# Patient Record
Sex: Male | Born: 1950 | Race: White | Hispanic: No | Marital: Married | Smoking: Current every day smoker
Health system: Southern US, Community
[De-identification: ages and names within clinical notes are randomized; demographics above are authoritative.]

## PROBLEM LIST (undated history)

## (undated) DIAGNOSIS — E119 Type 2 diabetes mellitus without complications: Secondary | ICD-10-CM

## (undated) DIAGNOSIS — E7800 Pure hypercholesterolemia, unspecified: Secondary | ICD-10-CM

## (undated) DIAGNOSIS — I739 Peripheral vascular disease, unspecified: Secondary | ICD-10-CM

## (undated) DIAGNOSIS — D649 Anemia, unspecified: Secondary | ICD-10-CM

## (undated) DIAGNOSIS — E785 Hyperlipidemia, unspecified: Secondary | ICD-10-CM

## (undated) DIAGNOSIS — K219 Gastro-esophageal reflux disease without esophagitis: Secondary | ICD-10-CM

## (undated) DIAGNOSIS — R05 Cough: Secondary | ICD-10-CM

## (undated) DIAGNOSIS — J189 Pneumonia, unspecified organism: Secondary | ICD-10-CM

## (undated) DIAGNOSIS — R079 Chest pain, unspecified: Secondary | ICD-10-CM

## (undated) DIAGNOSIS — J45909 Unspecified asthma, uncomplicated: Secondary | ICD-10-CM

## (undated) DIAGNOSIS — R059 Cough, unspecified: Secondary | ICD-10-CM

## (undated) DIAGNOSIS — R7303 Prediabetes: Secondary | ICD-10-CM

## (undated) DIAGNOSIS — F32A Depression, unspecified: Secondary | ICD-10-CM

## (undated) DIAGNOSIS — K861 Other chronic pancreatitis: Secondary | ICD-10-CM

## (undated) DIAGNOSIS — F329 Major depressive disorder, single episode, unspecified: Secondary | ICD-10-CM

## (undated) DIAGNOSIS — M1990 Unspecified osteoarthritis, unspecified site: Secondary | ICD-10-CM

## (undated) DIAGNOSIS — I499 Cardiac arrhythmia, unspecified: Secondary | ICD-10-CM

## (undated) DIAGNOSIS — F17210 Nicotine dependence, cigarettes, uncomplicated: Secondary | ICD-10-CM

## (undated) DIAGNOSIS — K635 Polyp of colon: Secondary | ICD-10-CM

## (undated) DIAGNOSIS — Z87442 Personal history of urinary calculi: Secondary | ICD-10-CM

## (undated) DIAGNOSIS — I10 Essential (primary) hypertension: Secondary | ICD-10-CM

## (undated) DIAGNOSIS — J209 Acute bronchitis, unspecified: Secondary | ICD-10-CM

## (undated) DIAGNOSIS — G4700 Insomnia, unspecified: Secondary | ICD-10-CM

## (undated) DIAGNOSIS — N19 Unspecified kidney failure: Secondary | ICD-10-CM

## (undated) HISTORY — DX: Hyperlipidemia, unspecified: E78.5

## (undated) HISTORY — DX: Unspecified kidney failure: N19

## (undated) HISTORY — DX: Peripheral vascular disease, unspecified: I73.9

## (undated) HISTORY — DX: Major depressive disorder, single episode, unspecified: F32.9

## (undated) HISTORY — PX: CARDIAC CATHETERIZATION: SHX172

## (undated) HISTORY — DX: Chest pain, unspecified: R07.9

## (undated) HISTORY — DX: Nicotine dependence, cigarettes, uncomplicated: F17.210

## (undated) HISTORY — PX: CHOLECYSTECTOMY: SHX55

## (undated) HISTORY — DX: Insomnia, unspecified: G47.00

## (undated) HISTORY — DX: Pure hypercholesterolemia, unspecified: E78.00

## (undated) HISTORY — DX: Acute bronchitis, unspecified: J20.9

## (undated) HISTORY — DX: Depression, unspecified: F32.A

## (undated) HISTORY — DX: Gastro-esophageal reflux disease without esophagitis: K21.9

## (undated) HISTORY — DX: Cough, unspecified: R05.9

## (undated) HISTORY — DX: Type 2 diabetes mellitus without complications: E11.9

## (undated) HISTORY — PX: PANCREAS SURGERY: SHX731

## (undated) HISTORY — DX: Other chronic pancreatitis: K86.1

## (undated) HISTORY — DX: Cough: R05

## (undated) HISTORY — DX: Essential (primary) hypertension: I10

## (undated) HISTORY — PX: COLONOSCOPY: SHX174

---

## 2002-09-02 ENCOUNTER — Ambulatory Visit (HOSPITAL_COMMUNITY): Admission: RE | Admit: 2002-09-02 | Discharge: 2002-09-02 | Payer: Self-pay | Admitting: Cardiology

## 2006-11-10 HISTORY — PX: CIRCUMCISION: SUR203

## 2011-03-28 NOTE — Cardiovascular Report (Signed)
   NAME:  Chad Mccall, Chad Mccall                       ACCOUNT NO.:  [id number]   MEDICAL RECORD NO.:  [id number]                   PATIENT TYPE:  OIB   LOCATION:  2899                                 FACILITY:  [facility]   PHYSICIAN:  Jenkins Rouge, MD [facility]                DATE OF BIRTH:  [birth year]   DATE OF PROCEDURE:  DATE OF DISCHARGE:  09/02/2002                              CARDIAC CATHETERIZATION   PROCEDURES:  Coronary arteriography.   CARDIOLOGIST:  Jenkins Rouge, MD   ACCESS:  Right femoral artery.   RESULTS:  1. The left main coronary artery  had a 20% discrete stenosis.  2. Left anterior descending artery was normal.  3. The first diagonal branch had a 50% tubular lesion at the ostium.  4. Circumflex coronary artery was normal.  5. The right coronary artery had a 30% proximal lesion. At the end of the     catheter, there was some catheter tip spasm.  6. The mid and distal vessel were normal.  There was a 30% ostial PDA     lesion.  7. Left ventriculography was normal, EF 60%.  There was no gradient across     the aortic valve and no MR.  Aortic pressure was in the 100/51 range.  LV     pressure was in the 100/10 range.   IMPRESSION:  Will review the films with Dr. Albertine Patricia; however, the patient's  Cardiolite showed a question of a small area of inferior apical wall  ischemia.  This does not correspond to the area of the diagonal branch.  In  talking to the patient, his primary complaints have been dyspnea.  He does  not have in my mind convincing angina since the diagonal lesion does not  correspond to the area of potential ischemia and the Cardiolite study.  I  think an initial attempt at medical therapy would be warranted.   I will review the films with Dr. Albertine Patricia and see if he concurs.                                                Jenkins Rouge, MD [facility]    PN/MEDQ  D:  09/02/2002  T:  09/03/2002  Job:  [id number]   cc:   Satira Sark, M.D. [facility]

## 2011-10-11 DIAGNOSIS — N19 Unspecified kidney failure: Secondary | ICD-10-CM

## 2011-10-11 HISTORY — DX: Unspecified kidney failure: N19

## 2012-08-10 DIAGNOSIS — R079 Chest pain, unspecified: Secondary | ICD-10-CM

## 2018-04-12 DIAGNOSIS — I10 Essential (primary) hypertension: Secondary | ICD-10-CM | POA: Diagnosis not present

## 2018-04-12 DIAGNOSIS — G4700 Insomnia, unspecified: Secondary | ICD-10-CM | POA: Diagnosis not present

## 2018-04-12 DIAGNOSIS — Z6824 Body mass index (BMI) 24.0-24.9, adult: Secondary | ICD-10-CM | POA: Diagnosis not present

## 2018-04-12 DIAGNOSIS — Z299 Encounter for prophylactic measures, unspecified: Secondary | ICD-10-CM | POA: Diagnosis not present

## 2018-04-12 DIAGNOSIS — Z713 Dietary counseling and surveillance: Secondary | ICD-10-CM | POA: Diagnosis not present

## 2018-06-10 DIAGNOSIS — I10 Essential (primary) hypertension: Secondary | ICD-10-CM | POA: Diagnosis not present

## 2018-06-10 DIAGNOSIS — G4700 Insomnia, unspecified: Secondary | ICD-10-CM | POA: Diagnosis not present

## 2018-06-10 DIAGNOSIS — I739 Peripheral vascular disease, unspecified: Secondary | ICD-10-CM | POA: Diagnosis not present

## 2018-06-10 DIAGNOSIS — K861 Other chronic pancreatitis: Secondary | ICD-10-CM | POA: Diagnosis not present

## 2018-06-10 DIAGNOSIS — R05 Cough: Secondary | ICD-10-CM | POA: Diagnosis not present

## 2018-06-10 DIAGNOSIS — Z299 Encounter for prophylactic measures, unspecified: Secondary | ICD-10-CM | POA: Diagnosis not present

## 2018-06-10 DIAGNOSIS — R079 Chest pain, unspecified: Secondary | ICD-10-CM | POA: Diagnosis not present

## 2018-06-10 DIAGNOSIS — Z6824 Body mass index (BMI) 24.0-24.9, adult: Secondary | ICD-10-CM | POA: Diagnosis not present

## 2018-06-10 DIAGNOSIS — F17210 Nicotine dependence, cigarettes, uncomplicated: Secondary | ICD-10-CM | POA: Diagnosis not present

## 2018-06-21 DIAGNOSIS — R079 Chest pain, unspecified: Secondary | ICD-10-CM | POA: Diagnosis not present

## 2018-06-21 DIAGNOSIS — R9431 Abnormal electrocardiogram [ECG] [EKG]: Secondary | ICD-10-CM | POA: Diagnosis not present

## 2018-07-08 DIAGNOSIS — I10 Essential (primary) hypertension: Secondary | ICD-10-CM | POA: Diagnosis not present

## 2018-07-08 DIAGNOSIS — E7800 Pure hypercholesterolemia, unspecified: Secondary | ICD-10-CM | POA: Diagnosis not present

## 2018-07-08 DIAGNOSIS — F329 Major depressive disorder, single episode, unspecified: Secondary | ICD-10-CM | POA: Diagnosis not present

## 2018-07-16 ENCOUNTER — Encounter: Payer: Self-pay | Admitting: *Deleted

## 2018-07-26 ENCOUNTER — Encounter

## 2018-07-26 ENCOUNTER — Encounter: Payer: Self-pay | Admitting: *Deleted

## 2018-07-26 ENCOUNTER — Encounter: Payer: Self-pay | Admitting: Cardiovascular Disease

## 2018-07-26 ENCOUNTER — Ambulatory Visit: Payer: PRIVATE HEALTH INSURANCE | Admitting: Cardiovascular Disease

## 2018-07-26 VITALS — BP 133/73 | HR 74 | Ht 64.0 in | Wt 141.6 lb

## 2018-07-26 DIAGNOSIS — R079 Chest pain, unspecified: Secondary | ICD-10-CM

## 2018-07-26 DIAGNOSIS — Z720 Tobacco use: Secondary | ICD-10-CM | POA: Diagnosis not present

## 2018-07-26 DIAGNOSIS — Z01812 Encounter for preprocedural laboratory examination: Secondary | ICD-10-CM | POA: Diagnosis not present

## 2018-07-26 DIAGNOSIS — E785 Hyperlipidemia, unspecified: Secondary | ICD-10-CM

## 2018-07-26 DIAGNOSIS — R0989 Other specified symptoms and signs involving the circulatory and respiratory systems: Secondary | ICD-10-CM

## 2018-07-26 DIAGNOSIS — I10 Essential (primary) hypertension: Secondary | ICD-10-CM

## 2018-07-26 NOTE — Progress Notes (Signed)
CARDIOLOGY CONSULT NOTE  Patient ID: Chad Mccall MRN: [id number] DOB/AGE: July 06, 1951 67 y.o.  Admit date: (Not on file) Primary Physician: Glenda Chroman, MD Referring Physician: Glenda Chroman, MD  Reason for Consultation: Chest pain  HPI: Chad Mccall is a 67 y.o. male who is being seen today for the evaluation of chest pain at the request of Vyas, Dhruv B, MD.  I personally reviewed records from his PCP.  I personally reviewed an ECG performed on 06/10/2018 that demonstrated normal sinus rhythm with no ischemic ST segment or T wave abnormality's, nor any arrhythmias.  I reviewed an echocardiogram report dated 06/21/2018 which demonstrated vigorous left ventricular systolic function, LVEF 5993%, mild LVH, and diastolic dysfunction.  Past medical history includes hyperlipidemia, tobacco abuse, and hypertension.  He tells me he has had chest pain on and off for years.  It is sometimes on the left side of his chest and sometimes on the right side of his chest.  It occasionally radiates into his back.  It is sometimes associated with left arm numbness.  He said he underwent coronary angiography at [facility] in 2005 and had minor blockages.  He is undergone 2 or 3 nuclear stress test most recently roughly 10 years ago at [facility].  About 3 months ago he was taking his autistic grandson to [place].  His grandson is 6 feet tall.  He almost fell on him and as the patient was trying to balance himself, he experienced severe precordial pain.  Brought him to the ground.  He said it was the worst type of chest pain he is ever experienced.  He Thana Farr himself down and the pain subsided.  He did not want to go to the ED because he was with his grandson and he was worried about his care.  He has chest pain on a near daily basis for the past year.  He has been smoking a pack of cigarettes daily since the age of 35.  He said his heart often races and he has been on metoprolol  and isosorbide mononitrate since 1997.  His blood pressure will suddenly drop for no particular reason.  Family history: There is a strong family history of premature coronary artery disease and CHF in both parents and paternal and maternal aunts and uncles.  Many people succumbed to heart disease by the age of 22.   Allergies  Allergen Reactions  . Codeine Hives    Palpitations     Current Outpatient Medications  Medication Sig Dispense Refill  . albuterol (VENTOLIN HFA) 108 (90 Base) MCG/ACT inhaler Inhale 2 puffs into the lungs 4 (four) times daily as needed for wheezing or shortness of breath.    . benzonatate (TESSALON) 100 MG capsule Take 2 capsules by mouth 3 (three) times daily as needed.  2  . chlorpheniramine (CHLOR-TRIMETON) 4 MG tablet Take 4 mg by mouth 2 (two) times daily as needed for allergies.    Marland Kitchen dicyclomine (BENTYL) 10 MG capsule Take 10 mg by mouth 2 (two) times daily.    . fenofibrate 160 MG tablet Take 160 mg by mouth daily.    . fosinopril (MONOPRIL) 10 MG tablet Take 1 tablet by mouth daily.    Marland Kitchen gabapentin (NEURONTIN) 300 MG capsule Take 300 mg by mouth 2 (two) times daily.    Marland Kitchen ibuprofen (ADVIL,MOTRIN) 200 MG tablet Take 1 tablet by mouth 3 (three) times daily as needed.    Marland Kitchen  isosorbide mononitrate (IMDUR) 30 MG 24 hr tablet Take 1 tablet by mouth 2 (two) times daily.    . meloxicam (MOBIC) 15 MG tablet Take 1 tablet by mouth daily.    . metoprolol tartrate (LOPRESSOR) 50 MG tablet Take 50 mg by mouth. One tab every morning & 1/2 every evening    . nitroGLYCERIN (NITROSTAT) 0.4 MG SL tablet Place 1 tablet under the tongue every 5 (five) minutes x 3 doses as needed.    . ondansetron (ZOFRAN) 4 MG tablet Take 4 mg by mouth every 8 (eight) hours as needed for nausea or vomiting.    . pantoprazole (PROTONIX) 40 MG tablet Take 40 mg by mouth daily.    . promethazine (PHENERGAN) 25 MG tablet Take 25 mg by mouth every 6 (six) hours as needed for nausea or vomiting.     . ranitidine (ZANTAC) 300 MG tablet Take 300 mg by mouth daily.    . simvastatin (ZOCOR) 40 MG tablet Take 40 mg by mouth daily.    . temazepam (RESTORIL) 15 MG capsule Take 15 mg by mouth at bedtime.    Marland Kitchen terazosin (HYTRIN) 5 MG capsule Take 5 mg by mouth daily.    . traMADol (ULTRAM) 50 MG tablet Take 50 mg by mouth 3 (three) times daily as needed.     . traZODone (DESYREL) 50 MG tablet Take 50 mg by mouth at bedtime.    . triamcinolone cream (KENALOG) 0.1 % Apply 1 application topically 2 (two) times daily as needed.     No current facility-administered medications for this visit.     Past Medical History:  Diagnosis Date  . Acute bronchitis   . Chest pain   . Chronic pancreatitis ([place])   . Cough   . Depression    [facility], was put on Trazodone  . Diabetes mellitus without complication ([place])   . Esophageal reflux   . Hyperlipidemia   . Hypertension   . Insomnia   . Nicotine dependence, cigarettes, uncomplicated   . PAD (peripheral artery disease) ([place])   . Pure hypercholesterolemia   . Renal failure 10/2011   due to kidney stone passed spontaneously     Past Surgical History:  Procedure Laterality Date  . CIRCUMCISION  2008    Social History   Socioeconomic History  . Marital status: Married    Spouse name: Not on file  . Number of children: Not on file  . Years of education: Not on file  . Highest education level: Not on file  Occupational History  . Not on file  Social Needs  . Financial resource strain: Not on file  . Food insecurity:    Worry: Not on file    Inability: Not on file  . Transportation needs:    Medical: Not on file    Non-medical: Not on file  Tobacco Use  . Smoking status: Current Every Day Smoker    Packs/day: 1.50    Years: 16.00    Pack years: 24.00    Types: Cigarettes    Start date: 08/15/1965  . Smokeless tobacco: Never Used  Substance and Sexual Activity  . Alcohol use: Not Currently  . Drug use: Not on file  .  Sexual activity: Not on file  Lifestyle  . Physical activity:    Days per week: Not on file    Minutes per session: Not on file  . Stress: Not on file  Relationships  . Social connections:    Talks  on phone: Not on file    Gets together: Not on file    Attends religious service: Not on file    Active member of club or organization: Not on file    Attends meetings of clubs or organizations: Not on file    Relationship status: Not on file  . Intimate partner violence:    Fear of current or ex partner: Not on file    Emotionally abused: Not on file    Physically abused: Not on file    Forced sexual activity: Not on file  Other Topics Concern  . Not on file  Social History Narrative  . Not on file      Current Meds  Medication Sig  . albuterol (VENTOLIN HFA) 108 (90 Base) MCG/ACT inhaler Inhale 2 puffs into the lungs 4 (four) times daily as needed for wheezing or shortness of breath.  . benzonatate (TESSALON) 100 MG capsule Take 2 capsules by mouth 3 (three) times daily as needed.  . chlorpheniramine (CHLOR-TRIMETON) 4 MG tablet Take 4 mg by mouth 2 (two) times daily as needed for allergies.  Marland Kitchen dicyclomine (BENTYL) 10 MG capsule Take 10 mg by mouth 2 (two) times daily.  . fenofibrate 160 MG tablet Take 160 mg by mouth daily.  . fosinopril (MONOPRIL) 10 MG tablet Take 1 tablet by mouth daily.  Marland Kitchen gabapentin (NEURONTIN) 300 MG capsule Take 300 mg by mouth 2 (two) times daily.  Marland Kitchen ibuprofen (ADVIL,MOTRIN) 200 MG tablet Take 1 tablet by mouth 3 (three) times daily as needed.  . isosorbide mononitrate (IMDUR) 30 MG 24 hr tablet Take 1 tablet by mouth 2 (two) times daily.  . meloxicam (MOBIC) 15 MG tablet Take 1 tablet by mouth daily.  . metoprolol tartrate (LOPRESSOR) 50 MG tablet Take 50 mg by mouth. One tab every morning & 1/2 every evening  . nitroGLYCERIN (NITROSTAT) 0.4 MG SL tablet Place 1 tablet under the tongue every 5 (five) minutes x 3 doses as needed.  . ondansetron (ZOFRAN)  4 MG tablet Take 4 mg by mouth every 8 (eight) hours as needed for nausea or vomiting.  . pantoprazole (PROTONIX) 40 MG tablet Take 40 mg by mouth daily.  . promethazine (PHENERGAN) 25 MG tablet Take 25 mg by mouth every 6 (six) hours as needed for nausea or vomiting.  . ranitidine (ZANTAC) 300 MG tablet Take 300 mg by mouth daily.  . simvastatin (ZOCOR) 40 MG tablet Take 40 mg by mouth daily.  . temazepam (RESTORIL) 15 MG capsule Take 15 mg by mouth at bedtime.  Marland Kitchen terazosin (HYTRIN) 5 MG capsule Take 5 mg by mouth daily.  . traMADol (ULTRAM) 50 MG tablet Take 50 mg by mouth 3 (three) times daily as needed.   . traZODone (DESYREL) 50 MG tablet Take 50 mg by mouth at bedtime.  . triamcinolone cream (KENALOG) 0.1 % Apply 1 application topically 2 (two) times daily as needed.      Review of systems complete and found to be negative unless listed above in HPI    Physical exam Blood pressure 133/73, pulse 74, height 5\' 4"  (1.626 m), weight 141 lb 9.6 oz (64.2 kg), SpO2 96 %. General: NAD Neck: No JVD, no thyromegaly or thyroid nodule.  Lungs: Clear to auscultation bilaterally with normal respiratory effort. CV: Nondisplaced PMI. Regular rate and rhythm, normal S1/S2, no S3/S4, no murmur.  No peripheral edema.  Right carotid bruit.    Abdomen: Soft, nontender, no distention.  Skin: Intact without lesions  or rashes.  Neurologic: Alert and oriented x 3.  Psych: Normal affect. Extremities: No clubbing or cyanosis.  HEENT: Normal.   ECG: Most recent ECG reviewed.   Labs: No results found for: K, BUN, CREATININE, ALT, TSH, HGB   Lipids: No results found for: LDLCALC, LDLDIRECT, CHOL, TRIG, HDL      ASSESSMENT AND PLAN:   1.  Chest pain: Given his multiple cardiovascular risk factors as detailed above, I am concerned that he has ischemic heart disease.  He is currently on metoprolol, Imdur, and simvastatin.  He does not take aspirin because he said he bleeds and bruises too easily.  I  will arrange for coronary CT angiography for further evaluation.  2.  Hypertension: Blood pressure is controlled.  No changes to therapy.  3.  Hyperlipidemia: Currently on statin therapy.  I will obtain a copy of lipids from PCP.    4.  Right carotid bruit: Currently on statin therapy.  I will obtain carotid Dopplers.  5.  Tobacco abuse: He has smoked a pack of cigarettes daily since the age of 83.    Disposition: Follow up in 3 months  Signed: Kate Sable, M.D., F.A.C.C.  07/26/2018, 8:38 AM

## 2018-07-26 NOTE — Patient Instructions (Addendum)
Medication Instructions:  Continue all current medications.  Labwork:  BMET - order given today.   Do prior to your CT.    Testing/Procedures:  Coronary CT at [facility]. Your physician has requested that you have a carotid duplex. This test is an ultrasound of the carotid arteries in your neck. It looks at blood flow through these arteries that supply the brain with blood. Allow one hour for this exam. There are no restrictions or special instructions.  Office will contact with results via phone or letter.    Follow-Up: 3 months   Any Other Special Instructions Will Be Listed Below (If Applicable).  If you need a refill on your cardiac medications before your next appointment, please call your pharmacy.

## 2018-08-06 DIAGNOSIS — E7800 Pure hypercholesterolemia, unspecified: Secondary | ICD-10-CM | POA: Diagnosis not present

## 2018-08-06 DIAGNOSIS — F329 Major depressive disorder, single episode, unspecified: Secondary | ICD-10-CM | POA: Diagnosis not present

## 2018-08-06 DIAGNOSIS — I10 Essential (primary) hypertension: Secondary | ICD-10-CM | POA: Diagnosis not present

## 2018-08-12 ENCOUNTER — Ambulatory Visit (INDEPENDENT_AMBULATORY_CARE_PROVIDER_SITE_OTHER): Payer: Medicare PPO

## 2018-08-12 DIAGNOSIS — R0989 Other specified symptoms and signs involving the circulatory and respiratory systems: Secondary | ICD-10-CM | POA: Diagnosis not present

## 2018-08-13 ENCOUNTER — Telehealth: Payer: Self-pay | Admitting: *Deleted

## 2018-08-13 ENCOUNTER — Other Ambulatory Visit: Payer: Self-pay | Admitting: *Deleted

## 2018-08-13 DIAGNOSIS — R0989 Other specified symptoms and signs involving the circulatory and respiratory systems: Secondary | ICD-10-CM

## 2018-08-13 NOTE — Telephone Encounter (Signed)
Notes recorded by Laurine Blazer, LPN on 79/07/8720 at 5:87 PM EDT Patient notified. Copy to pmd. Follow up scheduled for December with Dr. Bronson Ing.   ------  Notes recorded by Herminio Commons, MD on 08/13/2018 at 9:37 AM EDT Minimal carotid disease. This can be repeated in 3 to 5 years.

## 2018-08-24 DIAGNOSIS — Z01812 Encounter for preprocedural laboratory examination: Secondary | ICD-10-CM | POA: Diagnosis not present

## 2018-08-25 ENCOUNTER — Telehealth: Payer: Self-pay | Admitting: *Deleted

## 2018-08-25 ENCOUNTER — Encounter: Payer: Self-pay | Admitting: *Deleted

## 2018-08-25 DIAGNOSIS — R079 Chest pain, unspecified: Secondary | ICD-10-CM

## 2018-08-25 NOTE — Telephone Encounter (Signed)
Patient informed. Lexiscan instructions read to patient and mailed to home address.

## 2018-08-25 NOTE — Telephone Encounter (Signed)
Yes, can proceed with a Lexiscan.

## 2018-08-25 NOTE — Telephone Encounter (Signed)
Patient informed and verbalized understanding

## 2018-08-25 NOTE — Telephone Encounter (Signed)
-----   Message from Herminio Commons, MD sent at 08/25/2018 11:47 AM EDT ----- The following abnormalities are noted: BUN and creatinine are elevated consistent with stage IV chronic kidney disease. All other values are normal, stable or within acceptable limits. Medication changes / Follow up labs / Other changes or recommendations:   I would cancel coronary CT angiography and obtain an exercise Myoview stress test so as to avoid contrast. Please forward a copy to PCP.  Kate Sable, MD 08/25/2018 11:47 AM

## 2018-08-26 ENCOUNTER — Telehealth: Payer: Self-pay | Admitting: Cardiovascular Disease

## 2018-08-26 NOTE — Telephone Encounter (Signed)
Pre-cert Verification for the following procedure   Lexiscan scheduled for 08-30-2018 at [facility]

## 2018-08-30 ENCOUNTER — Encounter (HOSPITAL_BASED_OUTPATIENT_CLINIC_OR_DEPARTMENT_OTHER)
Admission: RE | Admit: 2018-08-30 | Discharge: 2018-08-30 | Disposition: A | Discharge status: Home / Self Care | Payer: Medicare PPO | Source: Ambulatory Visit | Attending: Cardiovascular Disease | Admitting: Cardiovascular Disease

## 2018-08-30 ENCOUNTER — Encounter (HOSPITAL_COMMUNITY): Payer: Self-pay

## 2018-08-30 ENCOUNTER — Encounter (HOSPITAL_COMMUNITY)
Admission: RE | Admit: 2018-08-30 | Discharge: 2018-08-30 | Disposition: A | Discharge status: Home / Self Care | Payer: Medicare PPO | Source: Ambulatory Visit | Attending: Cardiovascular Disease | Admitting: Cardiovascular Disease

## 2018-08-30 ENCOUNTER — Telehealth: Payer: Self-pay | Admitting: *Deleted

## 2018-08-30 DIAGNOSIS — R079 Chest pain, unspecified: Secondary | ICD-10-CM

## 2018-08-30 LAB — NM MYOCAR MULTI W/SPECT W/WALL MOTION / EF
CHL CUP RESTING HR STRESS: 67 {beats}/min
LHR: 0.54
LV dias vol: 79 mL (ref 62–150)
LVSYSVOL: 31 mL
Peak HR: 102 {beats}/min
SDS: 0
SRS: 1
SSS: 1
TID: 1.21

## 2018-08-30 MED ORDER — METOPROLOL TARTRATE 5 MG/5ML IV SOLN
INTRAVENOUS | Status: AC
  Filled 2018-08-30: qty 5

## 2018-08-30 MED ORDER — TECHNETIUM TC 99M TETROFOSMIN IV KIT
10.0000 | PACK | Freq: Once | INTRAVENOUS | Status: AC | PRN
  Administered 2018-08-30: 10.3 via INTRAVENOUS

## 2018-08-30 MED ORDER — TECHNETIUM TC 99M TETROFOSMIN IV KIT
30.0000 | PACK | Freq: Once | INTRAVENOUS | Status: AC | PRN
  Administered 2018-08-30: 31.8 via INTRAVENOUS

## 2018-08-30 MED ORDER — REGADENOSON 0.4 MG/5ML IV SOLN
INTRAVENOUS | Status: AC
  Administered 2018-08-30: 0.4 mg via INTRAVENOUS
  Filled 2018-08-30: qty 5

## 2018-08-30 MED ORDER — SODIUM CHLORIDE 0.9% FLUSH
INTRAVENOUS | Status: AC
  Administered 2018-08-30: 10 mL via INTRAVENOUS
  Filled 2018-08-30: qty 10

## 2018-08-30 NOTE — Telephone Encounter (Signed)
Notes recorded by Laurine Blazer, LPN on 00/51/1021 at 5:26 PM EDT Patient notified. Copy to pmd. Follow up scheduled for 11/08/2018 with Dr. Bronson Ing.   ------  Notes recorded by Herminio Commons, MD on 08/30/2018 at 4:24 PM EDT This study demonstrates: Normal. No evidence of significant blockages. Medication changes / Follow up studies / Other recommendations:  None. Please send results to the PCP: Glenda Chroman, MD  Kate Sable, MD 08/30/2018 4:23 PM

## 2018-09-03 DIAGNOSIS — I10 Essential (primary) hypertension: Secondary | ICD-10-CM | POA: Diagnosis not present

## 2018-09-03 DIAGNOSIS — F329 Major depressive disorder, single episode, unspecified: Secondary | ICD-10-CM | POA: Diagnosis not present

## 2018-09-03 DIAGNOSIS — E7800 Pure hypercholesterolemia, unspecified: Secondary | ICD-10-CM | POA: Diagnosis not present

## 2018-09-06 ENCOUNTER — Ambulatory Visit (HOSPITAL_COMMUNITY): Payer: Medicare PPO

## 2018-09-10 DIAGNOSIS — I10 Essential (primary) hypertension: Secondary | ICD-10-CM | POA: Diagnosis not present

## 2018-09-10 DIAGNOSIS — Z6824 Body mass index (BMI) 24.0-24.9, adult: Secondary | ICD-10-CM | POA: Diagnosis not present

## 2018-09-10 DIAGNOSIS — I739 Peripheral vascular disease, unspecified: Secondary | ICD-10-CM | POA: Diagnosis not present

## 2018-09-10 DIAGNOSIS — K921 Melena: Secondary | ICD-10-CM | POA: Diagnosis not present

## 2018-09-10 DIAGNOSIS — Z299 Encounter for prophylactic measures, unspecified: Secondary | ICD-10-CM | POA: Diagnosis not present

## 2018-09-10 DIAGNOSIS — E785 Hyperlipidemia, unspecified: Secondary | ICD-10-CM | POA: Diagnosis not present

## 2018-09-16 DIAGNOSIS — K922 Gastrointestinal hemorrhage, unspecified: Secondary | ICD-10-CM | POA: Diagnosis not present

## 2018-09-16 DIAGNOSIS — E1122 Type 2 diabetes mellitus with diabetic chronic kidney disease: Secondary | ICD-10-CM | POA: Diagnosis not present

## 2018-09-16 DIAGNOSIS — K625 Hemorrhage of anus and rectum: Secondary | ICD-10-CM | POA: Diagnosis not present

## 2018-09-16 DIAGNOSIS — R195 Other fecal abnormalities: Secondary | ICD-10-CM | POA: Diagnosis not present

## 2018-09-16 DIAGNOSIS — K641 Second degree hemorrhoids: Secondary | ICD-10-CM | POA: Diagnosis not present

## 2018-09-16 DIAGNOSIS — R42 Dizziness and giddiness: Secondary | ICD-10-CM | POA: Diagnosis not present

## 2018-09-16 DIAGNOSIS — K921 Melena: Secondary | ICD-10-CM | POA: Diagnosis not present

## 2018-09-16 DIAGNOSIS — I2510 Atherosclerotic heart disease of native coronary artery without angina pectoris: Secondary | ICD-10-CM | POA: Diagnosis not present

## 2018-09-16 DIAGNOSIS — I129 Hypertensive chronic kidney disease with stage 1 through stage 4 chronic kidney disease, or unspecified chronic kidney disease: Secondary | ICD-10-CM | POA: Diagnosis not present

## 2018-09-16 DIAGNOSIS — N184 Chronic kidney disease, stage 4 (severe): Secondary | ICD-10-CM | POA: Diagnosis not present

## 2018-09-16 DIAGNOSIS — K5730 Diverticulosis of large intestine without perforation or abscess without bleeding: Secondary | ICD-10-CM | POA: Diagnosis not present

## 2018-09-16 DIAGNOSIS — R05 Cough: Secondary | ICD-10-CM | POA: Diagnosis not present

## 2018-09-16 DIAGNOSIS — D62 Acute posthemorrhagic anemia: Secondary | ICD-10-CM | POA: Diagnosis not present

## 2018-09-17 DIAGNOSIS — I129 Hypertensive chronic kidney disease with stage 1 through stage 4 chronic kidney disease, or unspecified chronic kidney disease: Secondary | ICD-10-CM | POA: Diagnosis not present

## 2018-09-17 DIAGNOSIS — K641 Second degree hemorrhoids: Secondary | ICD-10-CM | POA: Diagnosis not present

## 2018-09-17 DIAGNOSIS — N184 Chronic kidney disease, stage 4 (severe): Secondary | ICD-10-CM | POA: Diagnosis not present

## 2018-09-17 DIAGNOSIS — D62 Acute posthemorrhagic anemia: Secondary | ICD-10-CM | POA: Diagnosis not present

## 2018-09-17 DIAGNOSIS — R195 Other fecal abnormalities: Secondary | ICD-10-CM | POA: Diagnosis not present

## 2018-09-17 DIAGNOSIS — R42 Dizziness and giddiness: Secondary | ICD-10-CM | POA: Diagnosis not present

## 2018-09-17 DIAGNOSIS — K5730 Diverticulosis of large intestine without perforation or abscess without bleeding: Secondary | ICD-10-CM | POA: Diagnosis not present

## 2018-09-17 DIAGNOSIS — E1122 Type 2 diabetes mellitus with diabetic chronic kidney disease: Secondary | ICD-10-CM | POA: Diagnosis not present

## 2018-09-17 DIAGNOSIS — K922 Gastrointestinal hemorrhage, unspecified: Secondary | ICD-10-CM | POA: Diagnosis not present

## 2018-09-17 DIAGNOSIS — I2510 Atherosclerotic heart disease of native coronary artery without angina pectoris: Secondary | ICD-10-CM | POA: Diagnosis not present

## 2018-09-17 DIAGNOSIS — D509 Iron deficiency anemia, unspecified: Secondary | ICD-10-CM | POA: Diagnosis not present

## 2018-09-17 DIAGNOSIS — I10 Essential (primary) hypertension: Secondary | ICD-10-CM | POA: Diagnosis not present

## 2018-09-17 DIAGNOSIS — J45909 Unspecified asthma, uncomplicated: Secondary | ICD-10-CM | POA: Diagnosis not present

## 2018-09-17 DIAGNOSIS — K625 Hemorrhage of anus and rectum: Secondary | ICD-10-CM | POA: Diagnosis not present

## 2018-09-17 DIAGNOSIS — E119 Type 2 diabetes mellitus without complications: Secondary | ICD-10-CM | POA: Diagnosis not present

## 2018-09-17 DIAGNOSIS — K5733 Diverticulitis of large intestine without perforation or abscess with bleeding: Secondary | ICD-10-CM | POA: Diagnosis not present

## 2018-09-23 DIAGNOSIS — E785 Hyperlipidemia, unspecified: Secondary | ICD-10-CM | POA: Diagnosis not present

## 2018-09-23 DIAGNOSIS — Z6824 Body mass index (BMI) 24.0-24.9, adult: Secondary | ICD-10-CM | POA: Diagnosis not present

## 2018-09-23 DIAGNOSIS — K922 Gastrointestinal hemorrhage, unspecified: Secondary | ICD-10-CM | POA: Diagnosis not present

## 2018-09-23 DIAGNOSIS — I739 Peripheral vascular disease, unspecified: Secondary | ICD-10-CM | POA: Diagnosis not present

## 2018-09-23 DIAGNOSIS — I10 Essential (primary) hypertension: Secondary | ICD-10-CM | POA: Diagnosis not present

## 2018-09-23 DIAGNOSIS — Z299 Encounter for prophylactic measures, unspecified: Secondary | ICD-10-CM | POA: Diagnosis not present

## 2018-10-22 DIAGNOSIS — R5383 Other fatigue: Secondary | ICD-10-CM | POA: Diagnosis not present

## 2018-10-22 DIAGNOSIS — I10 Essential (primary) hypertension: Secondary | ICD-10-CM | POA: Diagnosis not present

## 2018-10-22 DIAGNOSIS — Z7189 Other specified counseling: Secondary | ICD-10-CM | POA: Diagnosis not present

## 2018-10-22 DIAGNOSIS — Z1211 Encounter for screening for malignant neoplasm of colon: Secondary | ICD-10-CM | POA: Diagnosis not present

## 2018-10-22 DIAGNOSIS — Z1331 Encounter for screening for depression: Secondary | ICD-10-CM | POA: Diagnosis not present

## 2018-10-22 DIAGNOSIS — Z299 Encounter for prophylactic measures, unspecified: Secondary | ICD-10-CM | POA: Diagnosis not present

## 2018-10-22 DIAGNOSIS — E7800 Pure hypercholesterolemia, unspecified: Secondary | ICD-10-CM | POA: Diagnosis not present

## 2018-10-22 DIAGNOSIS — Z1339 Encounter for screening examination for other mental health and behavioral disorders: Secondary | ICD-10-CM | POA: Diagnosis not present

## 2018-10-22 DIAGNOSIS — Z6824 Body mass index (BMI) 24.0-24.9, adult: Secondary | ICD-10-CM | POA: Diagnosis not present

## 2018-10-22 DIAGNOSIS — Z125 Encounter for screening for malignant neoplasm of prostate: Secondary | ICD-10-CM | POA: Diagnosis not present

## 2018-10-22 DIAGNOSIS — Z79899 Other long term (current) drug therapy: Secondary | ICD-10-CM | POA: Diagnosis not present

## 2018-10-22 DIAGNOSIS — Z0000 Encounter for general adult medical examination without abnormal findings: Secondary | ICD-10-CM | POA: Diagnosis not present

## 2018-10-28 DIAGNOSIS — F329 Major depressive disorder, single episode, unspecified: Secondary | ICD-10-CM | POA: Diagnosis not present

## 2018-10-28 DIAGNOSIS — I10 Essential (primary) hypertension: Secondary | ICD-10-CM | POA: Diagnosis not present

## 2018-10-28 DIAGNOSIS — E7800 Pure hypercholesterolemia, unspecified: Secondary | ICD-10-CM | POA: Diagnosis not present

## 2018-11-08 ENCOUNTER — Ambulatory Visit: Payer: Medicare PPO | Admitting: Cardiovascular Disease

## 2018-11-08 ENCOUNTER — Encounter: Payer: Self-pay | Admitting: Cardiovascular Disease

## 2018-11-08 VITALS — BP 110/70 | HR 71 | Ht 64.0 in | Wt 146.0 lb

## 2018-11-08 DIAGNOSIS — R079 Chest pain, unspecified: Secondary | ICD-10-CM | POA: Diagnosis not present

## 2018-11-08 DIAGNOSIS — N184 Chronic kidney disease, stage 4 (severe): Secondary | ICD-10-CM | POA: Diagnosis not present

## 2018-11-08 DIAGNOSIS — I10 Essential (primary) hypertension: Secondary | ICD-10-CM

## 2018-11-08 DIAGNOSIS — E785 Hyperlipidemia, unspecified: Secondary | ICD-10-CM

## 2018-11-08 DIAGNOSIS — Z720 Tobacco use: Secondary | ICD-10-CM | POA: Diagnosis not present

## 2018-11-08 DIAGNOSIS — R0989 Other specified symptoms and signs involving the circulatory and respiratory systems: Secondary | ICD-10-CM

## 2018-11-08 NOTE — Patient Instructions (Signed)
Medication Instructions:  Continue all current medications.  Labwork: none  Testing/Procedures: none  Follow-Up: As needed.    Any Other Special Instructions Will Be Listed Below (If Applicable).  If you need a refill on your cardiac medications before your next appointment, please call your pharmacy.  

## 2018-11-08 NOTE — Progress Notes (Signed)
SUBJECTIVE: The patient returns for follow-up after undergoing cardiovascular testing performed for the evaluation of chest pain.  He underwent a normal nuclear stress test on 08/30/2018.  Carotid Dopplers demonstrated 1 to 39% bilateral internal carotid artery stenosis on 08/12/2018.  It appears since his last visit with me he had an intestinal bleed which required hospitalization.  He underwent a colonoscopy at that time.  He continues to have occasional mild chest pains but nothing severe.  He was also found to have chronic kidney disease stage IV after I checked a basic metabolic panel.  His PCP is arranging for him to see a nephrologist.  He denies palpitations and exertional dyspnea.  He denies orthopnea and leg swelling.     Review of Systems: As per "subjective", otherwise negative.  Allergies  Allergen Reactions  . Codeine Hives    Palpitations     Current Outpatient Medications  Medication Sig Dispense Refill  . albuterol (VENTOLIN HFA) 108 (90 Base) MCG/ACT inhaler Inhale 2 puffs into the lungs 4 (four) times daily as needed for wheezing or shortness of breath.    . benzonatate (TESSALON) 100 MG capsule Take 2 capsules by mouth 3 (three) times daily as needed.  2  . chlorpheniramine (CHLOR-TRIMETON) 4 MG tablet Take 4 mg by mouth 2 (two) times daily as needed for allergies.    Marland Kitchen dicyclomine (BENTYL) 10 MG capsule Take 10 mg by mouth 2 (two) times daily.    . fenofibrate 160 MG tablet Take 160 mg by mouth daily.    . fosinopril (MONOPRIL) 10 MG tablet Take 1 tablet by mouth daily.    Marland Kitchen gabapentin (NEURONTIN) 300 MG capsule Take 300 mg by mouth 2 (two) times daily.    Marland Kitchen ibuprofen (ADVIL,MOTRIN) 200 MG tablet Take 1 tablet by mouth 3 (three) times daily as needed.    . isosorbide mononitrate (IMDUR) 30 MG 24 hr tablet Take 1 tablet by mouth 2 (two) times daily.    . meloxicam (MOBIC) 15 MG tablet Take 1 tablet by mouth daily.    . metoprolol tartrate (LOPRESSOR)  50 MG tablet Take 50 mg by mouth. One tab every morning & 1/2 every evening    . nitroGLYCERIN (NITROSTAT) 0.4 MG SL tablet Place 1 tablet under the tongue every 5 (five) minutes x 3 doses as needed.    . ondansetron (ZOFRAN) 4 MG tablet Take 4 mg by mouth every 8 (eight) hours as needed for nausea or vomiting.    . pantoprazole (PROTONIX) 40 MG tablet Take 40 mg by mouth daily.    . promethazine (PHENERGAN) 25 MG tablet Take 25 mg by mouth every 6 (six) hours as needed for nausea or vomiting.    . ranitidine (ZANTAC) 300 MG tablet Take 300 mg by mouth daily.    . simvastatin (ZOCOR) 40 MG tablet Take 40 mg by mouth daily.    . temazepam (RESTORIL) 15 MG capsule Take 15 mg by mouth at bedtime.    Marland Kitchen terazosin (HYTRIN) 5 MG capsule Take 5 mg by mouth daily.    . traMADol (ULTRAM) 50 MG tablet Take 50 mg by mouth 3 (three) times daily as needed.     . traZODone (DESYREL) 50 MG tablet Take 50 mg by mouth at bedtime.    . triamcinolone cream (KENALOG) 0.1 % Apply 1 application topically 2 (two) times daily as needed.     No current facility-administered medications for this visit.     Past Medical  History:  Diagnosis Date  . Acute bronchitis   . Chest pain   . Chronic pancreatitis ([place])   . Cough   . Depression    [facility], was put on Trazodone  . Diabetes mellitus without complication ([place])   . Esophageal reflux   . Hyperlipidemia   . Hypertension   . Insomnia   . Nicotine dependence, cigarettes, uncomplicated   . PAD (peripheral artery disease) ([place])   . Pure hypercholesterolemia   . Renal failure 10/2011   due to kidney stone passed spontaneously     Past Surgical History:  Procedure Laterality Date  . CIRCUMCISION  2008    Social History   Socioeconomic History  . Marital status: Married    Spouse name: Not on file  . Number of children: Not on file  . Years of education: Not on file  . Highest education level: Not on file  Occupational History  . Not on  file  Social Needs  . Financial resource strain: Not on file  . Food insecurity:    Worry: Not on file    Inability: Not on file  . Transportation needs:    Medical: Not on file    Non-medical: Not on file  Tobacco Use  . Smoking status: Current Every Day Smoker    Packs/day: 1.50    Years: 16.00    Pack years: 24.00    Types: Cigarettes    Start date: 08/15/1965  . Smokeless tobacco: Never Used  Substance and Sexual Activity  . Alcohol use: Not Currently  . Drug use: Not on file  . Sexual activity: Not on file  Lifestyle  . Physical activity:    Days per week: Not on file    Minutes per session: Not on file  . Stress: Not on file  Relationships  . Social connections:    Talks on phone: Not on file    Gets together: Not on file    Attends religious service: Not on file    Active member of club or organization: Not on file    Attends meetings of clubs or organizations: Not on file    Relationship status: Not on file  . Intimate partner violence:    Fear of current or ex partner: Not on file    Emotionally abused: Not on file    Physically abused: Not on file    Forced sexual activity: Not on file  Other Topics Concern  . Not on file  Social History Narrative  . Not on file     Vitals:   11/08/18 1308  BP: 110/70  Pulse: 71  SpO2: 96%  Weight: 146 lb (66.2 kg)  Height: 5\' 4"  (1.626 m)    Wt Readings from Last 3 Encounters:  11/08/18 146 lb (66.2 kg)  07/26/18 141 lb 9.6 oz (64.2 kg)  06/10/18 146 lb (66.2 kg)     PHYSICAL EXAM General: NAD HEENT: Normal. Neck: No JVD, no thyromegaly. Lungs: Clear to auscultation bilaterally with normal respiratory effort. CV: Regular rate and rhythm, normal S1/S2, no S3/S4, no murmur. No pretibial or periankle edema.  Right carotid bruit.   Abdomen: Soft, nontender, no distention.  Neurologic: Alert and oriented.  Psych: Normal affect. Skin: Normal. Musculoskeletal: No gross deformities.    ECG: Reviewed above  under Subjective   Labs: No results found for: K, BUN, CREATININE, ALT, TSH, HGB   Lipids: No results found for: LDLCALC, LDLDIRECT, CHOL, TRIG, HDL     ASSESSMENT  AND PLAN: 1.  Chest pain: Normal nuclear stress test as detailed above.  No further cardiac testing is indicated at this time.  He is on beta-blockers and long-acting nitrates as prescribed by his PCP.  He is also on statin therapy.  2.  Hypertension: Controlled on present therapy.  No changes.  3.  Hyperlipidemia: Currently on statin therapy.  4.  Right carotid bruit: Carotid Dopplers reviewed above with minimal disease.  This can be repeated in several years.  5.  Tobacco abuse: He has smoked a pack of cigarettes daily since the age of 29.  89.  Chronic kidney disease stage IV: He is being referred to a nephrologist by his PCP.   Disposition: Follow up prn   Kate Sable, M.D., F.A.C.C.

## 2018-11-25 DIAGNOSIS — I10 Essential (primary) hypertension: Secondary | ICD-10-CM | POA: Diagnosis not present

## 2018-11-25 DIAGNOSIS — E7800 Pure hypercholesterolemia, unspecified: Secondary | ICD-10-CM | POA: Diagnosis not present

## 2018-11-25 DIAGNOSIS — F329 Major depressive disorder, single episode, unspecified: Secondary | ICD-10-CM | POA: Diagnosis not present

## 2018-11-30 DIAGNOSIS — N184 Chronic kidney disease, stage 4 (severe): Secondary | ICD-10-CM | POA: Diagnosis not present

## 2018-11-30 DIAGNOSIS — N390 Urinary tract infection, site not specified: Secondary | ICD-10-CM | POA: Diagnosis not present

## 2018-11-30 DIAGNOSIS — D649 Anemia, unspecified: Secondary | ICD-10-CM | POA: Diagnosis not present

## 2018-12-23 DIAGNOSIS — F329 Major depressive disorder, single episode, unspecified: Secondary | ICD-10-CM | POA: Diagnosis not present

## 2018-12-23 DIAGNOSIS — I10 Essential (primary) hypertension: Secondary | ICD-10-CM | POA: Diagnosis not present

## 2018-12-23 DIAGNOSIS — E7800 Pure hypercholesterolemia, unspecified: Secondary | ICD-10-CM | POA: Diagnosis not present

## 2019-02-04 DIAGNOSIS — E7800 Pure hypercholesterolemia, unspecified: Secondary | ICD-10-CM | POA: Diagnosis not present

## 2019-02-04 DIAGNOSIS — I10 Essential (primary) hypertension: Secondary | ICD-10-CM | POA: Diagnosis not present

## 2019-02-04 DIAGNOSIS — F329 Major depressive disorder, single episode, unspecified: Secondary | ICD-10-CM | POA: Diagnosis not present

## 2019-02-10 DIAGNOSIS — K861 Other chronic pancreatitis: Secondary | ICD-10-CM | POA: Diagnosis not present

## 2019-02-10 DIAGNOSIS — Z299 Encounter for prophylactic measures, unspecified: Secondary | ICD-10-CM | POA: Diagnosis not present

## 2019-02-10 DIAGNOSIS — I10 Essential (primary) hypertension: Secondary | ICD-10-CM | POA: Diagnosis not present

## 2019-02-10 DIAGNOSIS — G4700 Insomnia, unspecified: Secondary | ICD-10-CM | POA: Diagnosis not present

## 2019-02-10 DIAGNOSIS — Z79899 Other long term (current) drug therapy: Secondary | ICD-10-CM | POA: Diagnosis not present

## 2019-03-04 DIAGNOSIS — F329 Major depressive disorder, single episode, unspecified: Secondary | ICD-10-CM | POA: Diagnosis not present

## 2019-03-04 DIAGNOSIS — I10 Essential (primary) hypertension: Secondary | ICD-10-CM | POA: Diagnosis not present

## 2019-03-04 DIAGNOSIS — E7800 Pure hypercholesterolemia, unspecified: Secondary | ICD-10-CM | POA: Diagnosis not present

## 2019-04-01 DIAGNOSIS — E7800 Pure hypercholesterolemia, unspecified: Secondary | ICD-10-CM | POA: Diagnosis not present

## 2019-04-01 DIAGNOSIS — F329 Major depressive disorder, single episode, unspecified: Secondary | ICD-10-CM | POA: Diagnosis not present

## 2019-04-01 DIAGNOSIS — I10 Essential (primary) hypertension: Secondary | ICD-10-CM | POA: Diagnosis not present

## 2019-05-03 DIAGNOSIS — E7800 Pure hypercholesterolemia, unspecified: Secondary | ICD-10-CM | POA: Diagnosis not present

## 2019-05-03 DIAGNOSIS — I10 Essential (primary) hypertension: Secondary | ICD-10-CM | POA: Diagnosis not present

## 2019-05-03 DIAGNOSIS — F329 Major depressive disorder, single episode, unspecified: Secondary | ICD-10-CM | POA: Diagnosis not present

## 2019-05-06 DIAGNOSIS — I10 Essential (primary) hypertension: Secondary | ICD-10-CM | POA: Diagnosis not present

## 2019-05-06 DIAGNOSIS — Z713 Dietary counseling and surveillance: Secondary | ICD-10-CM | POA: Diagnosis not present

## 2019-05-06 DIAGNOSIS — Z299 Encounter for prophylactic measures, unspecified: Secondary | ICD-10-CM | POA: Diagnosis not present

## 2019-05-06 DIAGNOSIS — K861 Other chronic pancreatitis: Secondary | ICD-10-CM | POA: Diagnosis not present

## 2019-06-01 DIAGNOSIS — E7800 Pure hypercholesterolemia, unspecified: Secondary | ICD-10-CM | POA: Diagnosis not present

## 2019-06-01 DIAGNOSIS — I10 Essential (primary) hypertension: Secondary | ICD-10-CM | POA: Diagnosis not present

## 2019-06-01 DIAGNOSIS — F329 Major depressive disorder, single episode, unspecified: Secondary | ICD-10-CM | POA: Diagnosis not present

## 2019-07-15 DIAGNOSIS — I10 Essential (primary) hypertension: Secondary | ICD-10-CM | POA: Diagnosis not present

## 2019-07-15 DIAGNOSIS — E7800 Pure hypercholesterolemia, unspecified: Secondary | ICD-10-CM | POA: Diagnosis not present

## 2019-07-15 DIAGNOSIS — F329 Major depressive disorder, single episode, unspecified: Secondary | ICD-10-CM | POA: Diagnosis not present

## 2019-08-19 DIAGNOSIS — G4700 Insomnia, unspecified: Secondary | ICD-10-CM | POA: Diagnosis not present

## 2019-08-19 DIAGNOSIS — K861 Other chronic pancreatitis: Secondary | ICD-10-CM | POA: Diagnosis not present

## 2019-08-19 DIAGNOSIS — I10 Essential (primary) hypertension: Secondary | ICD-10-CM | POA: Diagnosis not present

## 2019-08-19 DIAGNOSIS — E785 Hyperlipidemia, unspecified: Secondary | ICD-10-CM | POA: Diagnosis not present

## 2019-08-19 DIAGNOSIS — F17210 Nicotine dependence, cigarettes, uncomplicated: Secondary | ICD-10-CM | POA: Diagnosis not present

## 2019-08-19 DIAGNOSIS — Z299 Encounter for prophylactic measures, unspecified: Secondary | ICD-10-CM | POA: Diagnosis not present

## 2019-09-14 DIAGNOSIS — I10 Essential (primary) hypertension: Secondary | ICD-10-CM | POA: Diagnosis not present

## 2019-09-14 DIAGNOSIS — F329 Major depressive disorder, single episode, unspecified: Secondary | ICD-10-CM | POA: Diagnosis not present

## 2019-09-14 DIAGNOSIS — E7800 Pure hypercholesterolemia, unspecified: Secondary | ICD-10-CM | POA: Diagnosis not present

## 2019-10-17 DIAGNOSIS — F329 Major depressive disorder, single episode, unspecified: Secondary | ICD-10-CM | POA: Diagnosis not present

## 2019-10-17 DIAGNOSIS — I10 Essential (primary) hypertension: Secondary | ICD-10-CM | POA: Diagnosis not present

## 2019-10-17 DIAGNOSIS — E7800 Pure hypercholesterolemia, unspecified: Secondary | ICD-10-CM | POA: Diagnosis not present

## 2019-10-31 DIAGNOSIS — Z79899 Other long term (current) drug therapy: Secondary | ICD-10-CM | POA: Diagnosis not present

## 2019-10-31 DIAGNOSIS — Z1339 Encounter for screening examination for other mental health and behavioral disorders: Secondary | ICD-10-CM | POA: Diagnosis not present

## 2019-10-31 DIAGNOSIS — I25119 Atherosclerotic heart disease of native coronary artery with unspecified angina pectoris: Secondary | ICD-10-CM | POA: Diagnosis not present

## 2019-10-31 DIAGNOSIS — Z299 Encounter for prophylactic measures, unspecified: Secondary | ICD-10-CM | POA: Diagnosis not present

## 2019-10-31 DIAGNOSIS — Z6822 Body mass index (BMI) 22.0-22.9, adult: Secondary | ICD-10-CM | POA: Diagnosis not present

## 2019-10-31 DIAGNOSIS — Z7189 Other specified counseling: Secondary | ICD-10-CM | POA: Diagnosis not present

## 2019-10-31 DIAGNOSIS — F17210 Nicotine dependence, cigarettes, uncomplicated: Secondary | ICD-10-CM | POA: Diagnosis not present

## 2019-10-31 DIAGNOSIS — R5383 Other fatigue: Secondary | ICD-10-CM | POA: Diagnosis not present

## 2019-10-31 DIAGNOSIS — E785 Hyperlipidemia, unspecified: Secondary | ICD-10-CM | POA: Diagnosis not present

## 2019-10-31 DIAGNOSIS — Z0000 Encounter for general adult medical examination without abnormal findings: Secondary | ICD-10-CM | POA: Diagnosis not present

## 2019-10-31 DIAGNOSIS — Z125 Encounter for screening for malignant neoplasm of prostate: Secondary | ICD-10-CM | POA: Diagnosis not present

## 2019-10-31 DIAGNOSIS — Z1331 Encounter for screening for depression: Secondary | ICD-10-CM | POA: Diagnosis not present

## 2019-10-31 DIAGNOSIS — I10 Essential (primary) hypertension: Secondary | ICD-10-CM | POA: Diagnosis not present

## 2019-11-18 DIAGNOSIS — Z299 Encounter for prophylactic measures, unspecified: Secondary | ICD-10-CM | POA: Diagnosis not present

## 2019-11-18 DIAGNOSIS — F17210 Nicotine dependence, cigarettes, uncomplicated: Secondary | ICD-10-CM | POA: Diagnosis not present

## 2019-11-18 DIAGNOSIS — J449 Chronic obstructive pulmonary disease, unspecified: Secondary | ICD-10-CM | POA: Diagnosis not present

## 2019-11-18 DIAGNOSIS — I25119 Atherosclerotic heart disease of native coronary artery with unspecified angina pectoris: Secondary | ICD-10-CM | POA: Diagnosis not present

## 2019-11-18 DIAGNOSIS — K861 Other chronic pancreatitis: Secondary | ICD-10-CM | POA: Diagnosis not present

## 2019-11-18 DIAGNOSIS — G4700 Insomnia, unspecified: Secondary | ICD-10-CM | POA: Diagnosis not present

## 2019-12-09 DIAGNOSIS — I10 Essential (primary) hypertension: Secondary | ICD-10-CM | POA: Diagnosis not present

## 2019-12-09 DIAGNOSIS — E7800 Pure hypercholesterolemia, unspecified: Secondary | ICD-10-CM | POA: Diagnosis not present

## 2019-12-09 DIAGNOSIS — F329 Major depressive disorder, single episode, unspecified: Secondary | ICD-10-CM | POA: Diagnosis not present

## 2019-12-12 DIAGNOSIS — R109 Unspecified abdominal pain: Secondary | ICD-10-CM | POA: Diagnosis not present

## 2019-12-12 DIAGNOSIS — D649 Anemia, unspecified: Secondary | ICD-10-CM | POA: Diagnosis not present

## 2019-12-12 DIAGNOSIS — E785 Hyperlipidemia, unspecified: Secondary | ICD-10-CM | POA: Diagnosis not present

## 2019-12-12 DIAGNOSIS — N390 Urinary tract infection, site not specified: Secondary | ICD-10-CM | POA: Diagnosis not present

## 2019-12-12 DIAGNOSIS — Z299 Encounter for prophylactic measures, unspecified: Secondary | ICD-10-CM | POA: Diagnosis not present

## 2019-12-12 DIAGNOSIS — Z6823 Body mass index (BMI) 23.0-23.9, adult: Secondary | ICD-10-CM | POA: Diagnosis not present

## 2020-02-01 DIAGNOSIS — F329 Major depressive disorder, single episode, unspecified: Secondary | ICD-10-CM | POA: Diagnosis not present

## 2020-02-01 DIAGNOSIS — E7800 Pure hypercholesterolemia, unspecified: Secondary | ICD-10-CM | POA: Diagnosis not present

## 2020-02-01 DIAGNOSIS — I10 Essential (primary) hypertension: Secondary | ICD-10-CM | POA: Diagnosis not present

## 2020-02-17 DIAGNOSIS — Z299 Encounter for prophylactic measures, unspecified: Secondary | ICD-10-CM | POA: Diagnosis not present

## 2020-02-17 DIAGNOSIS — F17210 Nicotine dependence, cigarettes, uncomplicated: Secondary | ICD-10-CM | POA: Diagnosis not present

## 2020-02-17 DIAGNOSIS — G4700 Insomnia, unspecified: Secondary | ICD-10-CM | POA: Diagnosis not present

## 2020-02-17 DIAGNOSIS — E785 Hyperlipidemia, unspecified: Secondary | ICD-10-CM | POA: Diagnosis not present

## 2020-02-17 DIAGNOSIS — F329 Major depressive disorder, single episode, unspecified: Secondary | ICD-10-CM | POA: Diagnosis not present

## 2020-03-07 DIAGNOSIS — F329 Major depressive disorder, single episode, unspecified: Secondary | ICD-10-CM | POA: Diagnosis not present

## 2020-03-07 DIAGNOSIS — I10 Essential (primary) hypertension: Secondary | ICD-10-CM | POA: Diagnosis not present

## 2020-03-07 DIAGNOSIS — E7800 Pure hypercholesterolemia, unspecified: Secondary | ICD-10-CM | POA: Diagnosis not present

## 2020-03-16 DIAGNOSIS — J329 Chronic sinusitis, unspecified: Secondary | ICD-10-CM | POA: Diagnosis not present

## 2020-03-16 DIAGNOSIS — I10 Essential (primary) hypertension: Secondary | ICD-10-CM | POA: Diagnosis not present

## 2020-03-16 DIAGNOSIS — Z299 Encounter for prophylactic measures, unspecified: Secondary | ICD-10-CM | POA: Diagnosis not present

## 2020-03-16 DIAGNOSIS — I25119 Atherosclerotic heart disease of native coronary artery with unspecified angina pectoris: Secondary | ICD-10-CM | POA: Diagnosis not present

## 2020-03-16 DIAGNOSIS — I739 Peripheral vascular disease, unspecified: Secondary | ICD-10-CM | POA: Diagnosis not present

## 2020-03-16 DIAGNOSIS — J449 Chronic obstructive pulmonary disease, unspecified: Secondary | ICD-10-CM | POA: Diagnosis not present

## 2020-04-02 DIAGNOSIS — K121 Other forms of stomatitis: Secondary | ICD-10-CM | POA: Diagnosis not present

## 2020-04-02 DIAGNOSIS — N390 Urinary tract infection, site not specified: Secondary | ICD-10-CM | POA: Diagnosis not present

## 2020-04-02 DIAGNOSIS — R6884 Jaw pain: Secondary | ICD-10-CM | POA: Diagnosis not present

## 2020-04-02 DIAGNOSIS — Z299 Encounter for prophylactic measures, unspecified: Secondary | ICD-10-CM | POA: Diagnosis not present

## 2020-04-02 DIAGNOSIS — I10 Essential (primary) hypertension: Secondary | ICD-10-CM | POA: Diagnosis not present

## 2020-04-02 DIAGNOSIS — R300 Dysuria: Secondary | ICD-10-CM | POA: Diagnosis not present

## 2020-04-24 DIAGNOSIS — R110 Nausea: Secondary | ICD-10-CM | POA: Diagnosis not present

## 2020-04-24 DIAGNOSIS — Z299 Encounter for prophylactic measures, unspecified: Secondary | ICD-10-CM | POA: Diagnosis not present

## 2020-04-24 DIAGNOSIS — I25119 Atherosclerotic heart disease of native coronary artery with unspecified angina pectoris: Secondary | ICD-10-CM | POA: Diagnosis not present

## 2020-04-24 DIAGNOSIS — K122 Cellulitis and abscess of mouth: Secondary | ICD-10-CM | POA: Diagnosis not present

## 2020-04-24 DIAGNOSIS — N400 Enlarged prostate without lower urinary tract symptoms: Secondary | ICD-10-CM | POA: Diagnosis not present

## 2020-04-24 DIAGNOSIS — I10 Essential (primary) hypertension: Secondary | ICD-10-CM | POA: Diagnosis not present

## 2020-05-09 DIAGNOSIS — I10 Essential (primary) hypertension: Secondary | ICD-10-CM | POA: Diagnosis not present

## 2020-05-09 DIAGNOSIS — F329 Major depressive disorder, single episode, unspecified: Secondary | ICD-10-CM | POA: Diagnosis not present

## 2020-05-09 DIAGNOSIS — E7800 Pure hypercholesterolemia, unspecified: Secondary | ICD-10-CM | POA: Diagnosis not present

## 2020-06-04 DIAGNOSIS — E785 Hyperlipidemia, unspecified: Secondary | ICD-10-CM | POA: Diagnosis not present

## 2020-06-04 DIAGNOSIS — I25119 Atherosclerotic heart disease of native coronary artery with unspecified angina pectoris: Secondary | ICD-10-CM | POA: Diagnosis not present

## 2020-06-04 DIAGNOSIS — K047 Periapical abscess without sinus: Secondary | ICD-10-CM | POA: Diagnosis not present

## 2020-06-04 DIAGNOSIS — J329 Chronic sinusitis, unspecified: Secondary | ICD-10-CM | POA: Diagnosis not present

## 2020-06-04 DIAGNOSIS — Z299 Encounter for prophylactic measures, unspecified: Secondary | ICD-10-CM | POA: Diagnosis not present

## 2020-06-08 DIAGNOSIS — F329 Major depressive disorder, single episode, unspecified: Secondary | ICD-10-CM | POA: Diagnosis not present

## 2020-06-08 DIAGNOSIS — E7800 Pure hypercholesterolemia, unspecified: Secondary | ICD-10-CM | POA: Diagnosis not present

## 2020-06-08 DIAGNOSIS — I10 Essential (primary) hypertension: Secondary | ICD-10-CM | POA: Diagnosis not present

## 2020-06-20 DIAGNOSIS — E7800 Pure hypercholesterolemia, unspecified: Secondary | ICD-10-CM | POA: Diagnosis not present

## 2020-06-20 DIAGNOSIS — I10 Essential (primary) hypertension: Secondary | ICD-10-CM | POA: Diagnosis not present

## 2020-06-20 DIAGNOSIS — F329 Major depressive disorder, single episode, unspecified: Secondary | ICD-10-CM | POA: Diagnosis not present

## 2020-06-21 DIAGNOSIS — G4700 Insomnia, unspecified: Secondary | ICD-10-CM | POA: Diagnosis not present

## 2020-06-21 DIAGNOSIS — I25119 Atherosclerotic heart disease of native coronary artery with unspecified angina pectoris: Secondary | ICD-10-CM | POA: Diagnosis not present

## 2020-06-21 DIAGNOSIS — F17210 Nicotine dependence, cigarettes, uncomplicated: Secondary | ICD-10-CM | POA: Diagnosis not present

## 2020-06-21 DIAGNOSIS — I10 Essential (primary) hypertension: Secondary | ICD-10-CM | POA: Diagnosis not present

## 2020-06-21 DIAGNOSIS — J449 Chronic obstructive pulmonary disease, unspecified: Secondary | ICD-10-CM | POA: Diagnosis not present

## 2020-06-21 DIAGNOSIS — Z299 Encounter for prophylactic measures, unspecified: Secondary | ICD-10-CM | POA: Diagnosis not present

## 2020-06-21 DIAGNOSIS — K861 Other chronic pancreatitis: Secondary | ICD-10-CM | POA: Diagnosis not present

## 2020-07-18 DIAGNOSIS — R5383 Other fatigue: Secondary | ICD-10-CM | POA: Diagnosis not present

## 2020-07-18 DIAGNOSIS — R6884 Jaw pain: Secondary | ICD-10-CM | POA: Diagnosis not present

## 2020-07-18 DIAGNOSIS — D649 Anemia, unspecified: Secondary | ICD-10-CM | POA: Diagnosis not present

## 2020-07-18 DIAGNOSIS — Z299 Encounter for prophylactic measures, unspecified: Secondary | ICD-10-CM | POA: Diagnosis not present

## 2020-07-18 DIAGNOSIS — K122 Cellulitis and abscess of mouth: Secondary | ICD-10-CM | POA: Diagnosis not present

## 2020-07-24 DIAGNOSIS — R6884 Jaw pain: Secondary | ICD-10-CM | POA: Diagnosis not present

## 2020-07-24 DIAGNOSIS — I10 Essential (primary) hypertension: Secondary | ICD-10-CM | POA: Diagnosis not present

## 2020-07-24 DIAGNOSIS — Z299 Encounter for prophylactic measures, unspecified: Secondary | ICD-10-CM | POA: Diagnosis not present

## 2020-07-24 DIAGNOSIS — J449 Chronic obstructive pulmonary disease, unspecified: Secondary | ICD-10-CM | POA: Diagnosis not present

## 2020-07-25 DIAGNOSIS — M8708 Idiopathic aseptic necrosis of bone, other site: Secondary | ICD-10-CM | POA: Diagnosis not present

## 2020-07-26 DIAGNOSIS — M8708 Idiopathic aseptic necrosis of bone, other site: Secondary | ICD-10-CM | POA: Diagnosis not present

## 2020-08-02 DIAGNOSIS — M869 Osteomyelitis, unspecified: Secondary | ICD-10-CM | POA: Diagnosis not present

## 2020-08-06 DIAGNOSIS — M868X8 Other osteomyelitis, other site: Secondary | ICD-10-CM | POA: Diagnosis not present

## 2020-08-06 DIAGNOSIS — N189 Chronic kidney disease, unspecified: Secondary | ICD-10-CM | POA: Diagnosis not present

## 2020-08-07 DIAGNOSIS — N189 Chronic kidney disease, unspecified: Secondary | ICD-10-CM | POA: Diagnosis not present

## 2020-08-07 DIAGNOSIS — M868X8 Other osteomyelitis, other site: Secondary | ICD-10-CM | POA: Diagnosis not present

## 2020-08-08 DIAGNOSIS — M868X8 Other osteomyelitis, other site: Secondary | ICD-10-CM | POA: Diagnosis not present

## 2020-08-08 DIAGNOSIS — N189 Chronic kidney disease, unspecified: Secondary | ICD-10-CM | POA: Diagnosis not present

## 2020-08-09 DIAGNOSIS — E7800 Pure hypercholesterolemia, unspecified: Secondary | ICD-10-CM | POA: Diagnosis not present

## 2020-08-09 DIAGNOSIS — I10 Essential (primary) hypertension: Secondary | ICD-10-CM | POA: Diagnosis not present

## 2020-08-09 DIAGNOSIS — N189 Chronic kidney disease, unspecified: Secondary | ICD-10-CM | POA: Diagnosis not present

## 2020-08-09 DIAGNOSIS — M868X8 Other osteomyelitis, other site: Secondary | ICD-10-CM | POA: Diagnosis not present

## 2020-08-09 DIAGNOSIS — F329 Major depressive disorder, single episode, unspecified: Secondary | ICD-10-CM | POA: Diagnosis not present

## 2020-08-10 DIAGNOSIS — M272 Inflammatory conditions of jaws: Secondary | ICD-10-CM | POA: Diagnosis not present

## 2020-08-11 DIAGNOSIS — M272 Inflammatory conditions of jaws: Secondary | ICD-10-CM | POA: Diagnosis not present

## 2020-08-12 DIAGNOSIS — M272 Inflammatory conditions of jaws: Secondary | ICD-10-CM | POA: Diagnosis not present

## 2020-08-13 DIAGNOSIS — M869 Osteomyelitis, unspecified: Secondary | ICD-10-CM | POA: Diagnosis not present

## 2020-08-13 DIAGNOSIS — M272 Inflammatory conditions of jaws: Secondary | ICD-10-CM | POA: Diagnosis not present

## 2020-08-14 DIAGNOSIS — M272 Inflammatory conditions of jaws: Secondary | ICD-10-CM | POA: Diagnosis not present

## 2020-08-15 DIAGNOSIS — E7800 Pure hypercholesterolemia, unspecified: Secondary | ICD-10-CM | POA: Diagnosis not present

## 2020-08-15 DIAGNOSIS — I2510 Atherosclerotic heart disease of native coronary artery without angina pectoris: Secondary | ICD-10-CM | POA: Diagnosis not present

## 2020-08-15 DIAGNOSIS — I10 Essential (primary) hypertension: Secondary | ICD-10-CM | POA: Diagnosis not present

## 2020-08-15 DIAGNOSIS — Z299 Encounter for prophylactic measures, unspecified: Secondary | ICD-10-CM | POA: Diagnosis not present

## 2020-08-15 DIAGNOSIS — M272 Inflammatory conditions of jaws: Secondary | ICD-10-CM | POA: Diagnosis not present

## 2020-08-16 DIAGNOSIS — M272 Inflammatory conditions of jaws: Secondary | ICD-10-CM | POA: Diagnosis not present

## 2020-08-16 NOTE — Pre-Procedure Instructions (Addendum)
Chad Mccall  08/16/2020    Your procedure is scheduled on Monday, August 20, 2020 at 1:00 PM.   Report to [facility] Entrance "A" Admitting Office at 11:00 AM.   Call this number if you have problems the morning of surgery: [phone]  Do not smoke 24 hours prior to surgery.   Remember:  Do not eat or drink after midnight Sunday, 08/19/20.  Take these medicines the morning of surgery with A SIP OF WATER: Dicyclomine (Bentyl), Gabapentin (Neurontin), Isosorbide Mononitrate (Imdur), Metoprolol (Lopressor), Pantoprazole (Protonix), Ranitidine (Zantac), Benzonatate (Tessalon) - if needed, Nitroglycerin - if needed, Ondansetron (Zofran) - if needed, Tramadol (Ultram) - if needed, Albuterol inhaler (Ventolin) - if needed (bring inhaler with you day of surgery).  Stop NSAIDS (Mobic, Meloxicam, Ibuprofen, Aleve, etc) as of today prior to surgery. Do not use Multivitamins, Fish Oil, Aspirin products or Herbal medications prior to surgery.   Do not wear jewelry.  Do not wear lotions, powders, cologne or deodorant.  Men may shave face and neck.  Do not bring valuables to the hospital.  [facility] is not responsible for any belongings or valuables.  Contacts, dentures or bridgework may not be worn into surgery.  Leave your suitcase in the car.  After surgery it may be brought to your room.  For patients admitted to the hospital, discharge time will be determined by your treatment team.  Patients discharged the day of surgery will not be allowed to drive home.   [facility] - Preparing for Surgery  Before surgery, you can play an important role.  Because skin is not sterile, your skin needs to be as free of germs as possible.  You can reduce the number of germs on you skin by washing with CHG (chlorahexidine gluconate) soap before surgery.  CHG is an antiseptic cleaner which kills germs and bonds with the skin to continue killing germs even after washing.  Oral Hygiene is also  important in reducing the risk of infection.  Remember to brush your teeth with your regular toothpaste the morning of surgery.  Please DO NOT use if you have an allergy to CHG or antibacterial soaps.  If your skin becomes reddened/irritated stop using the CHG and inform your nurse when you arrive at Short Stay.  Do not shave (including legs and underarms) for at least 48 hours prior to the first CHG shower.  You may shave your face.  Please follow these instructions carefully:   1.  Shower with CHG Soap the night before surgery and the morning of Surgery.  2.  If you choose to wash your hair, wash your hair first as usual with your normal shampoo.  3.  After you shampoo, rinse your hair and body thoroughly to remove the shampoo. 4.  Use CHG as you would any other liquid soap.  You can apply chg directly to the skin and wash gently with a      scrungie or washcloth.           5.  Apply the CHG Soap to your body ONLY FROM THE NECK DOWN.   Do not use on open wounds or open sores. Avoid contact with your eyes, ears, mouth and genitals (private parts).  Wash genitals (private parts) with your normal soap - do this prior to using the CHG soap.  6.  Wash thoroughly, paying special attention to the area where your surgery will be performed.  7.  Thoroughly rinse your body with warm water  from the neck down.  8.  DO NOT shower/wash with your normal soap after using and rinsing off the CHG Soap.  9.  Pat yourself dry with a clean towel.            10.  Wear clean pajamas.            11.  Place clean sheets on your bed the night of your first shower and do not sleep with pets.  Day of Surgery  Shower as above. Do not apply any lotions/deodorants the morning of surgery.   Please wear clean clothes to the hospital. Remember to brush your teeth with toothpaste.  Please read over the fact sheets that you were given.

## 2020-08-17 ENCOUNTER — Other Ambulatory Visit (HOSPITAL_COMMUNITY)
Admission: RE | Admit: 2020-08-17 | Discharge: 2020-08-17 | Disposition: A | Discharge status: Home / Self Care | Payer: Medicare PPO | Source: Ambulatory Visit | Attending: Oral Surgery | Admitting: Oral Surgery

## 2020-08-17 ENCOUNTER — Encounter (HOSPITAL_COMMUNITY)
Admission: RE | Admit: 2020-08-17 | Discharge: 2020-08-17 | Disposition: A | Discharge status: Home / Self Care | Payer: Medicare PPO | Source: Ambulatory Visit | Attending: Oral Surgery | Admitting: Oral Surgery

## 2020-08-17 ENCOUNTER — Encounter (HOSPITAL_COMMUNITY): Payer: Self-pay

## 2020-08-17 ENCOUNTER — Other Ambulatory Visit: Payer: Self-pay

## 2020-08-17 DIAGNOSIS — M272 Inflammatory conditions of jaws: Secondary | ICD-10-CM | POA: Diagnosis not present

## 2020-08-17 DIAGNOSIS — Z20822 Contact with and (suspected) exposure to covid-19: Secondary | ICD-10-CM | POA: Insufficient documentation

## 2020-08-17 DIAGNOSIS — Z01818 Encounter for other preprocedural examination: Secondary | ICD-10-CM | POA: Insufficient documentation

## 2020-08-17 HISTORY — DX: Unspecified asthma, uncomplicated: J45.909

## 2020-08-17 HISTORY — DX: Pneumonia, unspecified organism: J18.9

## 2020-08-17 HISTORY — DX: Personal history of urinary calculi: Z87.442

## 2020-08-17 HISTORY — DX: Unspecified osteoarthritis, unspecified site: M19.90

## 2020-08-17 HISTORY — DX: Hypercalcemia: E83.52

## 2020-08-17 HISTORY — DX: Polyp of colon: K63.5

## 2020-08-17 HISTORY — DX: Anemia, unspecified: D64.9

## 2020-08-17 HISTORY — DX: Prediabetes: R73.03

## 2020-08-17 HISTORY — DX: Cardiac arrhythmia, unspecified: I49.9

## 2020-08-17 LAB — HEMOGLOBIN A1C
Hgb A1c MFr Bld: 4.5 % — ABNORMAL LOW (ref 4.8–5.6)
Mean Plasma Glucose: 82.45 mg/dL

## 2020-08-17 LAB — BASIC METABOLIC PANEL
Anion gap: 12 (ref 5–15)
BUN: 26 mg/dL — ABNORMAL HIGH (ref 8–23)
CO2: 19 mmol/L — ABNORMAL LOW (ref 22–32)
Calcium: 8.8 mg/dL — ABNORMAL LOW (ref 8.9–10.3)
Chloride: 115 mmol/L — ABNORMAL HIGH (ref 98–111)
Creatinine, Ser: 2.18 mg/dL — ABNORMAL HIGH (ref 0.61–1.24)
GFR, Estimated: 29 mL/min — ABNORMAL LOW (ref >60)
Glucose, Bld: 97 mg/dL (ref 70–99)
Potassium: 5.2 mmol/L — ABNORMAL HIGH (ref 3.5–5.1)
Sodium: 146 mmol/L — ABNORMAL HIGH (ref 135–145)

## 2020-08-17 LAB — CBC
HCT: 30.8 % — ABNORMAL LOW (ref 39.0–52.0)
Hemoglobin: 9.4 g/dL — ABNORMAL LOW (ref 13.0–17.0)
MCH: 33.8 pg (ref 26.0–34.0)
MCHC: 30.5 g/dL (ref 30.0–36.0)
MCV: 110.8 fL — ABNORMAL HIGH (ref 80.0–100.0)
Platelets: 155 10*3/uL (ref 150–400)
RBC: 2.78 MIL/uL — ABNORMAL LOW (ref 4.22–5.81)
RDW: 15.6 % — ABNORMAL HIGH (ref 11.5–15.5)
WBC: 7.9 10*3/uL (ref 4.0–10.5)
nRBC: 0 % (ref 0.0–0.2)

## 2020-08-17 LAB — SARS CORONAVIRUS 2 (TAT 6-24 HRS): SARS Coronavirus 2: NEGATIVE

## 2020-08-17 NOTE — Progress Notes (Signed)
PCP - Dr. Jerene Bears Cardiologist - Dr. Jacinta Shoe    EKG - today ECHO - 06/21/18  COVID TEST- scheduled for today  Pt states he was pre-diabetic, but has had normal  A1C's for several years and is no longer considered pre-diabetic. A1C done today. Has seen Dr. Jacinta Shoe in the past for a fast heart rate. In 2019 he was told to f/u with Dr. Jacinta Shoe as needed.    Anesthesia review: Yes - see above.  Patient denies shortness of breath, fever, cough and chest pain at PAT appointment   All instructions explained to the patient, with a verbal understanding of the material. Patient agrees to go over the instructions while at home for a better understanding. Patient also instructed to self quarantine after being tested for COVID-19. The opportunity to ask questions was provided.

## 2020-08-18 DIAGNOSIS — M272 Inflammatory conditions of jaws: Secondary | ICD-10-CM | POA: Diagnosis not present

## 2020-08-19 DIAGNOSIS — M272 Inflammatory conditions of jaws: Secondary | ICD-10-CM | POA: Diagnosis not present

## 2020-08-20 ENCOUNTER — Encounter (HOSPITAL_COMMUNITY)
Admission: RE | Disposition: A | Discharge status: Home / Self Care | Payer: Self-pay | Source: Home / Self Care | Attending: Oral Surgery

## 2020-08-20 ENCOUNTER — Ambulatory Visit (HOSPITAL_COMMUNITY)
Admission: RE | Admit: 2020-08-20 | Discharge: 2020-08-20 | Disposition: A | Discharge status: Home / Self Care | Payer: Medicare PPO | Attending: Oral Surgery | Admitting: Oral Surgery

## 2020-08-20 ENCOUNTER — Ambulatory Visit (HOSPITAL_COMMUNITY): Payer: Medicare PPO | Admitting: Vascular Surgery

## 2020-08-20 ENCOUNTER — Ambulatory Visit (HOSPITAL_COMMUNITY): Payer: Medicare PPO | Admitting: Certified Registered"

## 2020-08-20 ENCOUNTER — Other Ambulatory Visit: Payer: Self-pay

## 2020-08-20 ENCOUNTER — Encounter (HOSPITAL_COMMUNITY): Payer: Self-pay | Admitting: Oral Surgery

## 2020-08-20 DIAGNOSIS — I129 Hypertensive chronic kidney disease with stage 1 through stage 4 chronic kidney disease, or unspecified chronic kidney disease: Secondary | ICD-10-CM | POA: Insufficient documentation

## 2020-08-20 DIAGNOSIS — J45909 Unspecified asthma, uncomplicated: Secondary | ICD-10-CM | POA: Diagnosis not present

## 2020-08-20 DIAGNOSIS — Z794 Long term (current) use of insulin: Secondary | ICD-10-CM | POA: Insufficient documentation

## 2020-08-20 DIAGNOSIS — Z79899 Other long term (current) drug therapy: Secondary | ICD-10-CM | POA: Insufficient documentation

## 2020-08-20 DIAGNOSIS — M272 Inflammatory conditions of jaws: Secondary | ICD-10-CM | POA: Diagnosis not present

## 2020-08-20 DIAGNOSIS — J449 Chronic obstructive pulmonary disease, unspecified: Secondary | ICD-10-CM | POA: Insufficient documentation

## 2020-08-20 DIAGNOSIS — M869 Osteomyelitis, unspecified: Secondary | ICD-10-CM | POA: Diagnosis not present

## 2020-08-20 DIAGNOSIS — Z7951 Long term (current) use of inhaled steroids: Secondary | ICD-10-CM | POA: Diagnosis not present

## 2020-08-20 DIAGNOSIS — N1832 Chronic kidney disease, stage 3b: Secondary | ICD-10-CM | POA: Insufficient documentation

## 2020-08-20 DIAGNOSIS — D649 Anemia, unspecified: Secondary | ICD-10-CM | POA: Diagnosis not present

## 2020-08-20 DIAGNOSIS — Z8249 Family history of ischemic heart disease and other diseases of the circulatory system: Secondary | ICD-10-CM | POA: Diagnosis not present

## 2020-08-20 DIAGNOSIS — K219 Gastro-esophageal reflux disease without esophagitis: Secondary | ICD-10-CM | POA: Insufficient documentation

## 2020-08-20 DIAGNOSIS — M1990 Unspecified osteoarthritis, unspecified site: Secondary | ICD-10-CM | POA: Diagnosis not present

## 2020-08-20 DIAGNOSIS — Z820 Family history of epilepsy and other diseases of the nervous system: Secondary | ICD-10-CM | POA: Insufficient documentation

## 2020-08-20 DIAGNOSIS — M8448XA Pathological fracture, other site, initial encounter for fracture: Secondary | ICD-10-CM | POA: Diagnosis not present

## 2020-08-20 DIAGNOSIS — F17200 Nicotine dependence, unspecified, uncomplicated: Secondary | ICD-10-CM | POA: Insufficient documentation

## 2020-08-20 DIAGNOSIS — N189 Chronic kidney disease, unspecified: Secondary | ICD-10-CM | POA: Diagnosis not present

## 2020-08-20 DIAGNOSIS — D631 Anemia in chronic kidney disease: Secondary | ICD-10-CM | POA: Diagnosis not present

## 2020-08-20 DIAGNOSIS — E1151 Type 2 diabetes mellitus with diabetic peripheral angiopathy without gangrene: Secondary | ICD-10-CM | POA: Insufficient documentation

## 2020-08-20 DIAGNOSIS — Z833 Family history of diabetes mellitus: Secondary | ICD-10-CM | POA: Insufficient documentation

## 2020-08-20 DIAGNOSIS — E1122 Type 2 diabetes mellitus with diabetic chronic kidney disease: Secondary | ICD-10-CM | POA: Insufficient documentation

## 2020-08-20 DIAGNOSIS — M8468XA Pathological fracture in other disease, other site, initial encounter for fracture: Secondary | ICD-10-CM | POA: Diagnosis not present

## 2020-08-20 DIAGNOSIS — Z885 Allergy status to narcotic agent status: Secondary | ICD-10-CM | POA: Diagnosis not present

## 2020-08-20 HISTORY — PX: DEBRIDEMENT MANDIBLE: SHX5308

## 2020-08-20 HISTORY — PX: ORIF MANDIBULAR FRACTURE: SHX2127

## 2020-08-20 SURGERY — DEBRIDEMENT, MANDIBLE
Anesthesia: General | Site: Mouth | Laterality: Right

## 2020-08-20 MED ORDER — ORAL CARE MOUTH RINSE: 15.0000 mL | Freq: Once | OROMUCOSAL | Status: AC

## 2020-08-20 MED ORDER — MIDAZOLAM HCL 5 MG/5ML IJ SOLN
INTRAMUSCULAR | Status: DC | PRN
  Administered 2020-08-20 (×2): 1 mg via INTRAVENOUS

## 2020-08-20 MED ORDER — OXYMETAZOLINE HCL 0.05 % NA SOLN
NASAL | Status: DC | PRN
  Administered 2020-08-20 (×2): 1 via NASAL

## 2020-08-20 MED ORDER — ONDANSETRON HCL 4 MG/2ML IJ SOLN
4.0000 mg | Freq: Once | INTRAMUSCULAR | Status: AC | PRN
  Administered 2020-08-20: 4 mg via INTRAVENOUS

## 2020-08-20 MED ORDER — TRAMADOL HCL 50 MG PO TABS
50.0000 mg | ORAL_TABLET | Freq: Three times a day (TID) | ORAL | 1 refills | Status: DC | PRN
Start: 2020-08-20 — End: 2021-11-18

## 2020-08-20 MED ORDER — MIDAZOLAM HCL 2 MG/2ML IJ SOLN
INTRAMUSCULAR | Status: AC
  Filled 2020-08-20: qty 2

## 2020-08-20 MED ORDER — LIDOCAINE-EPINEPHRINE 1 %-1:100000 IJ SOLN
INTRAMUSCULAR | Status: DC | PRN
  Administered 2020-08-20: 5 mL

## 2020-08-20 MED ORDER — ACETAMINOPHEN 10 MG/ML IV SOLN
INTRAVENOUS | Status: DC | PRN
  Administered 2020-08-20: 1000 mg via INTRAVENOUS

## 2020-08-20 MED ORDER — FENTANYL CITRATE (PF) 250 MCG/5ML IJ SOLN
INTRAMUSCULAR | Status: AC
  Filled 2020-08-20: qty 5

## 2020-08-20 MED ORDER — DEXAMETHASONE SODIUM PHOSPHATE 10 MG/ML IJ SOLN
INTRAMUSCULAR | Status: DC | PRN
  Administered 2020-08-20: 10 mg via INTRAVENOUS

## 2020-08-20 MED ORDER — CHLORHEXIDINE GLUCONATE 0.12 % MT SOLN
15.0000 mL | Freq: Once | OROMUCOSAL | Status: AC
  Administered 2020-08-20: 15 mL via OROMUCOSAL
  Filled 2020-08-20: qty 15

## 2020-08-20 MED ORDER — SODIUM CHLORIDE 0.9 % IV SOLN
1.0000 g | INTRAVENOUS | Status: AC
  Administered 2020-08-20: 1 g via INTRAVENOUS
  Filled 2020-08-20: qty 1

## 2020-08-20 MED ORDER — 0.9 % SODIUM CHLORIDE (POUR BTL) OPTIME
TOPICAL | Status: DC | PRN
  Administered 2020-08-20: 1000 mL

## 2020-08-20 MED ORDER — LIDOCAINE-EPINEPHRINE 1 %-1:100000 IJ SOLN
INTRAMUSCULAR | Status: AC
  Filled 2020-08-20: qty 1

## 2020-08-20 MED ORDER — GLYCOPYRROLATE 0.2 MG/ML IJ SOLN
INTRAMUSCULAR | Status: DC | PRN
  Administered 2020-08-20 (×2): .1 mg via INTRAVENOUS

## 2020-08-20 MED ORDER — FENTANYL CITRATE (PF) 100 MCG/2ML IJ SOLN
INTRAMUSCULAR | Status: DC | PRN
  Administered 2020-08-20 (×3): 50 ug via INTRAVENOUS
  Administered 2020-08-20: 100 ug via INTRAVENOUS

## 2020-08-20 MED ORDER — ARTIFICIAL TEARS OPHTHALMIC OINT
TOPICAL_OINTMENT | OPHTHALMIC | Status: DC | PRN
  Administered 2020-08-20: 1

## 2020-08-20 MED ORDER — HYDROMORPHONE HCL 1 MG/ML IJ SOLN
INTRAMUSCULAR | Status: AC
  Filled 2020-08-20: qty 1

## 2020-08-20 MED ORDER — OXYMETAZOLINE HCL 0.05 % NA SOLN
NASAL | Status: AC
  Filled 2020-08-20: qty 30

## 2020-08-20 MED ORDER — LIDOCAINE 2% (20 MG/ML) 5 ML SYRINGE
INTRAMUSCULAR | Status: DC | PRN
  Administered 2020-08-20: 50 mg via INTRAVENOUS

## 2020-08-20 MED ORDER — CHLORHEXIDINE GLUCONATE 0.12 % MT SOLN: 15.0000 mL | Freq: Two times a day (BID) | OROMUCOSAL | 0 refills | Status: DC

## 2020-08-20 MED ORDER — PHENYLEPHRINE HCL (PRESSORS) 10 MG/ML IV SOLN
INTRAVENOUS | Status: DC | PRN
  Administered 2020-08-20: 80 ug via INTRAVENOUS
  Administered 2020-08-20: 40 ug via INTRAVENOUS
  Administered 2020-08-20: 80 ug via INTRAVENOUS

## 2020-08-20 MED ORDER — SUGAMMADEX SODIUM 200 MG/2ML IV SOLN
INTRAVENOUS | Status: DC | PRN
  Administered 2020-08-20: 150 mg via INTRAVENOUS

## 2020-08-20 MED ORDER — ACETAMINOPHEN 325 MG PO TABS: 325.0000 mg | ORAL_TABLET | ORAL | Status: DC | PRN

## 2020-08-20 MED ORDER — PHENYLEPHRINE HCL-NACL 10-0.9 MG/250ML-% IV SOLN
INTRAVENOUS | Status: DC | PRN
  Administered 2020-08-20: 50 ug/min via INTRAVENOUS

## 2020-08-20 MED ORDER — ONDANSETRON HCL 4 MG/2ML IJ SOLN
INTRAMUSCULAR | Status: AC
  Filled 2020-08-20: qty 2

## 2020-08-20 MED ORDER — ACETAMINOPHEN 160 MG/5ML PO SOLN: 325.0000 mg | ORAL | Status: DC | PRN

## 2020-08-20 MED ORDER — HYDROMORPHONE HCL 1 MG/ML IJ SOLN
0.2500 mg | INTRAMUSCULAR | Status: DC | PRN
  Administered 2020-08-20 (×2): 0.5 mg via INTRAVENOUS
  Administered 2020-08-20: 0.25 mg via INTRAVENOUS
  Administered 2020-08-20: 0.5 mg via INTRAVENOUS

## 2020-08-20 MED ORDER — ONDANSETRON HCL 4 MG/2ML IJ SOLN
INTRAMUSCULAR | Status: DC | PRN
  Administered 2020-08-20: 4 mg via INTRAVENOUS

## 2020-08-20 MED ORDER — ROCURONIUM BROMIDE 10 MG/ML (PF) SYRINGE
PREFILLED_SYRINGE | INTRAVENOUS | Status: DC | PRN
  Administered 2020-08-20: 70 mg via INTRAVENOUS

## 2020-08-20 MED ORDER — PROPOFOL 10 MG/ML IV BOLUS
INTRAVENOUS | Status: DC | PRN
  Administered 2020-08-20: 50 mg via INTRAVENOUS
  Administered 2020-08-20: 120 mg via INTRAVENOUS

## 2020-08-20 MED ORDER — LACTATED RINGERS IV SOLN: INTRAVENOUS | Status: DC

## 2020-08-20 SURGICAL SUPPLY — 64 items
BIT DRILL 1.9X115X35 (BIT) ×2 IMPLANT
BLADE RECIPRO TAPERED (BLADE) ×2 IMPLANT
BLADE SURG 10 STRL SS (BLADE) IMPLANT
BLADE SURG 15 STRL LF DISP TIS (BLADE) IMPLANT
BLADE SURG 15 STRL SS (BLADE)
BUR EGG ELITE 4.0 (BURR) ×2 IMPLANT
BUR SABER RD CUTTING 3.0 (BURR) ×2 IMPLANT
BUR STRYKR EGG 5.0 (BURR) IMPLANT
BUR SURG 4X8 MED (BURR) ×1 IMPLANT
BURR SURG 4X8 MED (BURR) ×2
CANISTER SUCT 3000ML PPV (MISCELLANEOUS) ×2 IMPLANT
CATH ROBINSON RED A/P 10FR (CATHETERS) ×2 IMPLANT
CLEANER TIP ELECTROSURG 2X2 (MISCELLANEOUS) ×2 IMPLANT
COVER SURGICAL LIGHT HANDLE (MISCELLANEOUS) ×2 IMPLANT
DECANTER SPIKE VIAL GLASS SM (MISCELLANEOUS) ×2 IMPLANT
DRAPE HALF SHEET 40X57 (DRAPES) ×2 IMPLANT
ELECT COATED BLADE 2.86 ST (ELECTRODE) ×2 IMPLANT
ELECT NEEDLE BLADE 2-5/6 (NEEDLE) IMPLANT
ELECT REM PT RETURN 9FT ADLT (ELECTROSURGICAL) ×2
ELECTRODE REM PT RTRN 9FT ADLT (ELECTROSURGICAL) ×1 IMPLANT
GAUZE PACKING FOLDED 1IN STRL (GAUZE/BANDAGES/DRESSINGS) ×2 IMPLANT
GLOVE BIO SURGEON STRL SZ8 (GLOVE) ×2 IMPLANT
GLOVE BIOGEL PI IND STRL 8 (GLOVE) ×1 IMPLANT
GLOVE BIOGEL PI INDICATOR 8 (GLOVE) ×1
GLOVE ECLIPSE 7.5 STRL STRAW (GLOVE) ×2 IMPLANT
GOWN STRL REUS W/ TWL LRG LVL3 (GOWN DISPOSABLE) ×2 IMPLANT
GOWN STRL REUS W/TWL LRG LVL3 (GOWN DISPOSABLE) ×4
GUIDE VIRTUAL SURG PLAN  CUT (MISCELLANEOUS) ×2
GUIDE VIRTUAL SURG PLAN CUT (MISCELLANEOUS) ×1 IMPLANT
KIT BASIN OR (CUSTOM PROCEDURE TRAY) ×2 IMPLANT
KIT TURNOVER KIT B (KITS) ×2 IMPLANT
NEEDLE HYPO 25GX1X1/2 BEV (NEEDLE) ×2 IMPLANT
NS IRRIG 1000ML POUR BTL (IV SOLUTION) ×2 IMPLANT
PAD ARMBOARD 7.5X6 YLW CONV (MISCELLANEOUS) ×2 IMPLANT
PATTIES SURGICAL .5 X3 (DISPOSABLE) IMPLANT
PENCIL BUTTON HOLSTER BLD 10FT (ELECTRODE) ×2 IMPLANT
PENCIL FOOT CONTROL (ELECTRODE) IMPLANT
PLAN VIRTUAL SURGICAL (MISCELLANEOUS) ×2 IMPLANT
PLATE MANDIBLE RECON HEMI 2.8 (Plate) ×2 IMPLANT
POSITIONER HEAD DONUT 9IN (MISCELLANEOUS) ×2 IMPLANT
PROTECTOR CORNEAL (OPHTHALMIC RELATED) IMPLANT
SCISSORS WIRE ANG 4 3/4 DISP (INSTRUMENTS) IMPLANT
SCREW BONE 2.7X10MM EMERG (Screw) ×2 IMPLANT
SCREW BONE 2.7X8MM EMERG (Screw) ×4 IMPLANT
SCREW LOCK 2.7X12MM EMERG (Screw) ×2 IMPLANT
SCREW MNDBLE 2.0X6 BONE (Screw) ×4 IMPLANT
SCREW MNDBLE 2.3X12MM BONE (Screw) ×4 IMPLANT
SCREW MNDBLE 2.3X6MM BONE (Screw) ×2 IMPLANT
SCREW MNDBLE 2.3X8MM BONE (Screw) ×12 IMPLANT
SUT CHROMIC 3 0 PS 2 (SUTURE) IMPLANT
SUT CHROMIC 4 0 PS 2 18 (SUTURE) IMPLANT
SUT ETHILON 5 0 P 3 18 (SUTURE)
SUT MNCRL AB 4-0 PS2 18 (SUTURE) ×2 IMPLANT
SUT NYLON ETHILON 5-0 P-3 1X18 (SUTURE) IMPLANT
SUT PROLENE 5 0 PS 2 (SUTURE) ×2 IMPLANT
SUT SILK 2 0 PERMA HAND 18 BK (SUTURE) IMPLANT
SUT STEEL 0 (SUTURE)
SUT STEEL 0 18XMFL TIE 17 (SUTURE) IMPLANT
SUT STEEL 4 (SUTURE) IMPLANT
SUT VIC AB 3-0 SH 27 (SUTURE) ×6
SUT VIC AB 3-0 SH 27X BRD (SUTURE) ×3 IMPLANT
TOWEL GREEN STERILE FF (TOWEL DISPOSABLE) ×2 IMPLANT
TRAY ENT MC OR (CUSTOM PROCEDURE TRAY) ×2 IMPLANT
WATER STERILE IRR 1000ML POUR (IV SOLUTION) ×2 IMPLANT

## 2020-08-20 NOTE — Brief Op Note (Signed)
08/20/2020  4:05 PM  PATIENT:  Chad Mccall  69 y.o. male  PRE-OPERATIVE DIAGNOSIS:  OSTEOMYELITIS  OF MANDIBLE  POST-OPERATIVE DIAGNOSIS:  OSTEOMYELITIS  OF MANDIBLE  PROCEDURE:  Procedure(s): DEBRIDEMENT MANDIBLE (Right) OPEN REDUCTION INTERNAL FIXATION (ORIF) MANDIBULAR FRACTURE (Right)  SURGEON:  Surgeon(s) and Role:    * [name], [name] - Primary    * Scarlet Abad, Conner Lenna Sciara, [name] - Assisting  PHYSICIAN ASSISTANT:   ASSISTANTS: none   ANESTHESIA:   general  EBL:  100 mL   BLOOD ADMINISTERED:none  DRAINS: none   LOCAL MEDICATIONS USED:  Amount: 10 ml  SPECIMEN:  Biopsy / Limited Resection  DISPOSITION OF SPECIMEN:  PATHOLOGY  COUNTS:  YES  TOURNIQUET:  * No tourniquets in log *  DICTATION: .Note written in EPIC  PLAN OF CARE: Discharge to home after PACU  PATIENT DISPOSITION:  PACU - hemodynamically stable.   Delay start of Pharmacological VTE agent (>24hrs) due to surgical blood loss or risk of bleeding: no

## 2020-08-20 NOTE — Anesthesia Preprocedure Evaluation (Addendum)
Anesthesia Evaluation  Patient identified by MRN, date of birth, ID band Patient awake    Reviewed: Patient's Chart, lab work & pertinent test results, reviewed documented beta blocker date and time   Airway Mallampati: III  TM Distance: >3 FB Neck ROM: Full  Mouth opening: Limited Mouth Opening  Dental  (+) Edentulous Upper, Edentulous Lower   Pulmonary asthma , Current Smoker,    Pulmonary exam normal        Cardiovascular hypertension, Pt. on medications + Peripheral Vascular Disease   Rhythm:Regular Rate:Normal     Neuro/Psych Depression negative neurological ROS     GI/Hepatic Neg liver ROS, GERD  Medicated,  Endo/Other  negative endocrine ROS  Renal/GU CRFRenal disease     Musculoskeletal  (+) Arthritis ,   Abdominal (+)  Abdomen: soft. Bowel sounds: normal.  Peds  Hematology  (+) Blood dyscrasia, anemia ,   Anesthesia Other Findings   Reproductive/Obstetrics                           Anesthesia Physical Anesthesia Plan  ASA: III  Anesthesia Plan: General   Post-op Pain Management:    Induction: Intravenous  PONV Risk Score and Plan: 1 and Ondansetron and Dexamethasone  Airway Management Planned: Mask and Nasal ETT  Additional Equipment: None  Intra-op Plan:   Post-operative Plan: Extubation in OR  Informed Consent: I have reviewed the patients History and Physical, chart, labs and discussed the procedure including the risks, benefits and alternatives for the proposed anesthesia with the patient or authorized representative who has indicated his/her understanding and acceptance.       Plan Discussed with: CRNA and Surgeon  Anesthesia Plan Comments: (Lab Results      Component                Value               Date                      WBC                      7.9                 08/17/2020                HGB                      9.4 (L)             08/17/2020                 HCT                      30.8 (L)            08/17/2020                MCV                      110.8 (H)           08/17/2020                PLT                      155  08/17/2020          )       Anesthesia Quick Evaluation

## 2020-08-20 NOTE — Transfer of Care (Signed)
Immediate Anesthesia Transfer of Care Note  Patient: Chad Mccall  Procedure(s) Performed: DEBRIDEMENT MANDIBLE (Right Mouth) OPEN REDUCTION INTERNAL FIXATION (ORIF) MANDIBULAR FRACTURE (Right Mouth)  Patient Location: PACU  Anesthesia Type:General  Level of Consciousness: awake, alert , oriented and patient cooperative  Airway & Oxygen Therapy: Patient Spontanous Breathing and Patient connected to face mask oxygen  Post-op Assessment: Report given to RN and Post -op Vital signs reviewed and stable  Post vital signs: Reviewed and stable  Last Vitals:  Vitals Value Taken Time  BP 149/78 08/20/20 1601  Temp 36 C 08/20/20 1600  Pulse 82 08/20/20 1608  Resp 13 08/20/20 1608  SpO2 100 % 08/20/20 1608  Vitals shown include unvalidated device data.  Last Pain:  Vitals:   08/20/20 1600  TempSrc:   PainSc: (P) Asleep      Patients Stated Pain Goal: 3 (94/17/40 8144)  Complications: No complications documented.

## 2020-08-20 NOTE — Anesthesia Procedure Notes (Addendum)
Procedure Name: Intubation Date/Time: 08/20/2020 1:06 PM Performed by: Cleda Daub, CRNA Pre-anesthesia Checklist: Patient identified, Emergency Drugs available, Suction available and Patient being monitored Patient Re-evaluated:Patient Re-evaluated prior to induction Oxygen Delivery Method: Circle system utilized Preoxygenation: Pre-oxygenation with 100% oxygen Induction Type: IV induction Ventilation: Mask ventilation without difficulty and Nasal airway inserted- appropriate to patient size Laryngoscope Size: Glidescope (for limited mouth opening) Grade View: Grade I Nasal Tubes: Nasal prep performed, Nasal Rae, Magill forceps- large, utilized and Left Tube size: 7.0 mm Number of attempts: 2 (met resistance on the first attempt; a red robbin used as a guide. ) Airway Equipment and Method: Video-laryngoscopy Placement Confirmation: ETT inserted through vocal cords under direct vision,  positive ETCO2 and breath sounds checked- equal and bilateral Tube secured with: Tape (secured on the forehead padded with blue towels. )

## 2020-08-20 NOTE — H&P (Signed)
  Patient was evaluated at bedside prior to the surgery. His H&P was completed with Dr. Woody Seller (Internal Medicine PCP) and there have been no changes since that visit. No contraindications to proceeding with surgery as planned today.

## 2020-08-20 NOTE — Discharge Instructions (Signed)
Maintain a full liquid diet for the next 6 weeks Ice to face for the next 48 hours to help with swelling Avoid pressure or contact to the right face, sleep on your left side Use Peridex rinse twice daily Continue with IV antibiotics as instructed by the infectious disease doctor Follow up with Dr. Conley Simmonds in 1 week

## 2020-08-21 NOTE — Anesthesia Postprocedure Evaluation (Signed)
Anesthesia Post Note  Patient: Chad Mccall  Procedure(s) Performed: DEBRIDEMENT MANDIBLE (Right Mouth) OPEN REDUCTION INTERNAL FIXATION (ORIF) MANDIBULAR FRACTURE (Right Mouth)     Patient location during evaluation: PACU Anesthesia Type: General Level of consciousness: patient cooperative and awake Pain management: pain level controlled Vital Signs Assessment: post-procedure vital signs reviewed and stable Respiratory status: spontaneous breathing, nonlabored ventilation, respiratory function stable and patient connected to nasal cannula oxygen Cardiovascular status: blood pressure returned to baseline and stable Postop Assessment: no apparent nausea or vomiting Anesthetic complications: no   No complications documented.  Last Vitals:  Vitals:   08/20/20 1700 08/20/20 1722  BP: (!) 154/96 113/71  Pulse: 84 98  Resp: 15 12  Temp:  36.6 C  SpO2: 94% 99%    Last Pain:  Vitals:   08/20/20 1700  TempSrc:   PainSc: 8                  Braidyn Peace

## 2020-08-21 NOTE — Op Note (Deleted)
  The note originally documented on this encounter has been moved the the encounter in which it belongs.  

## 2020-08-21 NOTE — Op Note (Signed)
OPERATIVE REPORT  Patient: Chad Mccall  DOB: 1951/11/04 Date of procedure: 08/20/20  Indications for Surgery: This is a 69 year old male who initially presented to the emergency department on 03/25/20 with an abscess of the right mandible due to a grossly carious tooth #31. He was given antibiotics and instructed to follow up with an oral surgeon. He was seen in the office and treated with an incision and drainage of a buccal space abscess and extraction of tooth #31. He was then lost to follow up and returned on 07/25/20 with complaint of chronic pain and exposed bone of the right mandible. Cultures were obtained and the site was superficially debrided. Panorex obtained, consistent with osteomyelitis and a CT scanwas ordered, confirming osteomyeilits with a pathologic fracture of the right mandible. He was referred to infectious disease who placed a PICC line and initiated IV antibiotics curtailed to the culture and sensitivity results pending surgical management. He has shown improvement in pain and swelling with the antibiotics and presents today for debridement and placement of reconstruction bar.   Preoperative Diagnosis: Osteomyelitis of right mandible, pathologic fracture right mandibular angle Postoperative Diagnosis: Osteomyelitis of right mandible, pathologic fracture right mandibular angle  Operation: Debridement right mandible and placement of reconstruction bar Surgeons: Dr. Zelphia Cairo, Dr. Terese Door Anesthesia: General Estimated Blood Loss: minimal Urine Output: not measured Specimen: culture right mandibular rosteomyelitis Complications: none  Description of Procedure: The patient was brought into the operating room. Patient was placed supine on the operating table. General anesthesia was induced and a nasotracheal tube was placed. The patient was prepped in a fashion suitable for this type of oral and maxillofacial surgical procedure and then draped. The surgeons donned  sterile gowns and gloves. Local anesthesia was utilized in the form of 1% lidocaine with 1: 100,000 epinephrine delivered via local infiltration with a total of 10 cc. The oral cavity was suctioned, and a moistened throat pack was placed.  A crestal incision was made with a #15 blade from the mandibular ramus to the anterior mandible with a vertical release anterior to the mental foramen. A full thickness mucoperiosteal flap was elevated and the mental nerve was identified and protected throughout the procedure. The incision was extended up the ramus using Bovie electrocautery and the temporalis tendon was stripped from the coronoid process. The dissection continued to the inferior and posterior border of the mandible. A predictive hole guide was then used to pre-drill holes for the custom reconstruction bar that was fabricated utilizing VSP preoperatively. The custom bar was then placed in the mouth, but the fracture was seen to be slightly displaced, making it difficult to seat the bar with proper reduction. The anterior aspect of the bar was then bent to allow passive fixation. The bar was secured with bicortical screws.   We then began the debridement of granulation tissue and necrotic bone from the anterior aspect of the ramus. It was determined that the coronoid process was also affected, so a coronoidectomy was performed with a reciprocating saw while protecting the medial soft tissues. We continued debridement of the alveolar process of the mandible until healthy appearing bone was encountered. The inferior alveolar nerve bundle was encountered, but remained intact. Both bone and soft tissue were sent for culture. The remaining bone was then smoothed to prevent dehiscence of the soft tissue. Affected soft tissue was excised. The surgical site was irrigated with NS and water tight closure was obtained with 3-0 Vicryl sutures.   Hemostasis was achieved. The  moistened throat pack was removed. The oral  cavity suctioned. Care was then turned over to the anesthesia team. The patient was successfully extubated and transported to the PACU for recovery.

## 2020-08-22 ENCOUNTER — Encounter (HOSPITAL_COMMUNITY): Payer: Self-pay | Admitting: Oral Surgery

## 2020-08-22 DIAGNOSIS — M272 Inflammatory conditions of jaws: Secondary | ICD-10-CM | POA: Diagnosis not present

## 2020-08-23 DIAGNOSIS — M869 Osteomyelitis, unspecified: Secondary | ICD-10-CM | POA: Diagnosis not present

## 2020-08-23 DIAGNOSIS — M272 Inflammatory conditions of jaws: Secondary | ICD-10-CM | POA: Diagnosis not present

## 2020-08-23 LAB — GLUCOSE, CAPILLARY: Glucose-Capillary: 116 mg/dL — ABNORMAL HIGH (ref 70–99)

## 2020-08-24 DIAGNOSIS — M272 Inflammatory conditions of jaws: Secondary | ICD-10-CM | POA: Diagnosis not present

## 2020-08-25 LAB — AEROBIC/ANAEROBIC CULTURE W GRAM STAIN (SURGICAL/DEEP WOUND)
Culture: NO GROWTH
Gram Stain: NONE SEEN

## 2020-08-25 NOTE — Op Note (Signed)
OPERATIVE REPORT  Patient: Chad Mccall  DOB: 1950/12/04 Date of procedure: 08/20/20  Indications for Surgery: This is a 69 year old male who initially presented to the emergency department on 03/25/20 with an abscess of the right mandible due to a grossly carious tooth #31. He was given antibiotics and instructed to follow up with an oral surgeon. He was seen in the office and treated with an incision and drainage of a buccal space abscess and extraction of tooth #31. He was then lost to follow up and returned on 07/25/20 with complaint of chronic pain and exposed bone of the right mandible. Cultures were obtained and the site was superficially debrided. Panorex obtained, consistent with osteomyelitis and a CT scanwas ordered, confirming osteomyeilits with a pathologic fracture of the right mandible. He was referred to infectious disease who placed a PICC line and initiated IV antibiotics curtailed to the culture and sensitivity results pending surgical management. He has shown improvement in pain and swelling with the antibiotics and presents today for debridement and placement of reconstruction bar.   Preoperative Diagnosis: Osteomyelitis of right mandible, pathologic fracture right mandibular angle Postoperative Diagnosis: Osteomyelitis of right mandible, pathologic fracture right mandibular angle  Operation: Debridement right mandible and placement of reconstruction bar Surgeons: Dr. Zelphia Cairo, Dr. Terese Door Anesthesia: General Estimated Blood Loss: minimal Urine Output: not measured Specimen: culture right mandibular rosteomyelitis Complications: none  Description of Procedure: The patient was brought into the operating room. Patient was placed supine on the operating table. General anesthesia was induced and a nasotracheal tube was placed. The patient was prepped in a fashion suitable for this type of oral and maxillofacial surgical procedure and then draped. The surgeons  donned sterile gowns and gloves. Local anesthesia was utilized in the form of 1% lidocaine with 1: 100,000 epinephrine delivered via local infiltration with a total of 10 cc. The oral cavity was suctioned, and a moistened throat pack was placed.  A crestal incision was made with a #15 blade from the mandibular ramus to the anterior mandible with a vertical release anterior to the mental foramen. A full thickness mucoperiosteal flap was elevated and the mental nerve was identified and protected throughout the procedure. The incision was extended up the ramus using Bovie electrocautery and the temporalis tendon was stripped from the coronoid process. The dissection continued to the inferior and posterior border of the mandible. A predictive hole guide was then used to pre-drill holes for the custom reconstruction bar that was fabricated utilizing VSP preoperatively. The custom bar was then placed in the mouth, but the fracture was seen to be slightly displaced, making it difficult to seat the bar with proper reduction. The anterior aspect of the bar was then bent to allow passive fixation. The bar was secured with bicortical screws.   We then began the debridement of granulation tissue and necrotic bone from the anterior aspect of the ramus. It was determined that the coronoid process was also affected, so a coronoidectomy was performed with a reciprocating saw while protecting the medial soft tissues. We continued debridement of the alveolar process of the mandible until healthy appearing bone was encountered. The inferior alveolar nerve bundle was encountered, but remained intact. Both bone and soft tissue were sent for culture. The remaining bone was then smoothed to prevent dehiscence of the soft tissue. Affected soft tissue was excised. The surgical site was irrigated with NS and water tight closure was obtained with 3-0 Vicryl sutures.   Hemostasis was achieved. The  moistened throat pack was removed.  The oral cavity suctioned. Care was then turned over to the anesthesia team. The patient was successfully extubated and transported to the PACU for recovery.

## 2020-08-26 DIAGNOSIS — M272 Inflammatory conditions of jaws: Secondary | ICD-10-CM | POA: Diagnosis not present

## 2020-08-27 DIAGNOSIS — M272 Inflammatory conditions of jaws: Secondary | ICD-10-CM | POA: Diagnosis not present

## 2020-08-28 DIAGNOSIS — M272 Inflammatory conditions of jaws: Secondary | ICD-10-CM | POA: Diagnosis not present

## 2020-08-29 DIAGNOSIS — M272 Inflammatory conditions of jaws: Secondary | ICD-10-CM | POA: Diagnosis not present

## 2020-08-30 DIAGNOSIS — M272 Inflammatory conditions of jaws: Secondary | ICD-10-CM | POA: Diagnosis not present

## 2020-08-31 DIAGNOSIS — M272 Inflammatory conditions of jaws: Secondary | ICD-10-CM | POA: Diagnosis not present

## 2020-09-01 DIAGNOSIS — M272 Inflammatory conditions of jaws: Secondary | ICD-10-CM | POA: Diagnosis not present

## 2020-09-02 DIAGNOSIS — M272 Inflammatory conditions of jaws: Secondary | ICD-10-CM | POA: Diagnosis not present

## 2020-09-03 DIAGNOSIS — M272 Inflammatory conditions of jaws: Secondary | ICD-10-CM | POA: Diagnosis not present

## 2020-09-05 DIAGNOSIS — M272 Inflammatory conditions of jaws: Secondary | ICD-10-CM | POA: Diagnosis not present

## 2020-09-06 DIAGNOSIS — M272 Inflammatory conditions of jaws: Secondary | ICD-10-CM | POA: Diagnosis not present

## 2020-09-07 DIAGNOSIS — M272 Inflammatory conditions of jaws: Secondary | ICD-10-CM | POA: Diagnosis not present

## 2020-09-07 DIAGNOSIS — M869 Osteomyelitis, unspecified: Secondary | ICD-10-CM | POA: Diagnosis not present

## 2020-09-07 DIAGNOSIS — F329 Major depressive disorder, single episode, unspecified: Secondary | ICD-10-CM | POA: Diagnosis not present

## 2020-09-07 DIAGNOSIS — E7800 Pure hypercholesterolemia, unspecified: Secondary | ICD-10-CM | POA: Diagnosis not present

## 2020-09-07 DIAGNOSIS — I10 Essential (primary) hypertension: Secondary | ICD-10-CM | POA: Diagnosis not present

## 2020-10-09 DIAGNOSIS — E7800 Pure hypercholesterolemia, unspecified: Secondary | ICD-10-CM | POA: Diagnosis not present

## 2020-10-09 DIAGNOSIS — I10 Essential (primary) hypertension: Secondary | ICD-10-CM | POA: Diagnosis not present

## 2020-10-09 DIAGNOSIS — F329 Major depressive disorder, single episode, unspecified: Secondary | ICD-10-CM | POA: Diagnosis not present

## 2020-10-24 DIAGNOSIS — N400 Enlarged prostate without lower urinary tract symptoms: Secondary | ICD-10-CM | POA: Diagnosis not present

## 2020-10-24 DIAGNOSIS — G4700 Insomnia, unspecified: Secondary | ICD-10-CM | POA: Diagnosis not present

## 2020-10-24 DIAGNOSIS — Z299 Encounter for prophylactic measures, unspecified: Secondary | ICD-10-CM | POA: Diagnosis not present

## 2020-10-24 DIAGNOSIS — I739 Peripheral vascular disease, unspecified: Secondary | ICD-10-CM | POA: Diagnosis not present

## 2020-10-24 DIAGNOSIS — I10 Essential (primary) hypertension: Secondary | ICD-10-CM | POA: Diagnosis not present

## 2020-10-24 DIAGNOSIS — F17210 Nicotine dependence, cigarettes, uncomplicated: Secondary | ICD-10-CM | POA: Diagnosis not present

## 2020-10-25 DIAGNOSIS — M8708 Idiopathic aseptic necrosis of bone, other site: Secondary | ICD-10-CM | POA: Diagnosis not present

## 2020-10-30 ENCOUNTER — Other Ambulatory Visit: Payer: Self-pay

## 2020-10-30 ENCOUNTER — Encounter (HOSPITAL_COMMUNITY): Payer: Self-pay | Admitting: Oral Surgery

## 2020-10-30 NOTE — Progress Notes (Signed)
Spoke with wife for pre-op call. States patient has not had any chest pain or shortness of breath within at least the last 30 days. Previous cardiologist Dr. Bronson Ing, last OV in 2019 with follow-up PRN noted. Educated on Environmental manager. States they live ~ 1 hour away and patient is at another appointment. COVID test DOS.

## 2020-10-31 ENCOUNTER — Other Ambulatory Visit: Payer: Self-pay

## 2020-10-31 ENCOUNTER — Ambulatory Visit (HOSPITAL_COMMUNITY): Payer: Medicare PPO | Admitting: Certified Registered"

## 2020-10-31 ENCOUNTER — Observation Stay (HOSPITAL_COMMUNITY)
Admission: RE | Admit: 2020-10-31 | Discharge: 2020-11-01 | Disposition: A | Discharge status: Home / Self Care | Payer: Medicare PPO | Attending: Oral Surgery | Admitting: Oral Surgery

## 2020-10-31 ENCOUNTER — Encounter (HOSPITAL_COMMUNITY)
Admission: RE | Disposition: A | Discharge status: Home / Self Care | Payer: Self-pay | Source: Home / Self Care | Attending: Oral Surgery

## 2020-10-31 DIAGNOSIS — Z79899 Other long term (current) drug therapy: Secondary | ICD-10-CM | POA: Insufficient documentation

## 2020-10-31 DIAGNOSIS — M272 Inflammatory conditions of jaws: Secondary | ICD-10-CM | POA: Diagnosis not present

## 2020-10-31 DIAGNOSIS — F17210 Nicotine dependence, cigarettes, uncomplicated: Secondary | ICD-10-CM | POA: Diagnosis not present

## 2020-10-31 DIAGNOSIS — I10 Essential (primary) hypertension: Secondary | ICD-10-CM

## 2020-10-31 DIAGNOSIS — I2510 Atherosclerotic heart disease of native coronary artery without angina pectoris: Secondary | ICD-10-CM | POA: Insufficient documentation

## 2020-10-31 DIAGNOSIS — J449 Chronic obstructive pulmonary disease, unspecified: Secondary | ICD-10-CM

## 2020-10-31 DIAGNOSIS — F32A Depression, unspecified: Secondary | ICD-10-CM | POA: Diagnosis not present

## 2020-10-31 DIAGNOSIS — M869 Osteomyelitis, unspecified: Secondary | ICD-10-CM | POA: Diagnosis not present

## 2020-10-31 DIAGNOSIS — N184 Chronic kidney disease, stage 4 (severe): Secondary | ICD-10-CM | POA: Diagnosis not present

## 2020-10-31 DIAGNOSIS — Z09 Encounter for follow-up examination after completed treatment for conditions other than malignant neoplasm: Secondary | ICD-10-CM

## 2020-10-31 DIAGNOSIS — E119 Type 2 diabetes mellitus without complications: Secondary | ICD-10-CM | POA: Diagnosis not present

## 2020-10-31 DIAGNOSIS — M8448XK Pathological fracture, other site, subsequent encounter for fracture with nonunion: Principal | ICD-10-CM | POA: Insufficient documentation

## 2020-10-31 DIAGNOSIS — D631 Anemia in chronic kidney disease: Secondary | ICD-10-CM | POA: Diagnosis not present

## 2020-10-31 DIAGNOSIS — G4700 Insomnia, unspecified: Secondary | ICD-10-CM | POA: Diagnosis not present

## 2020-10-31 DIAGNOSIS — Z20822 Contact with and (suspected) exposure to covid-19: Secondary | ICD-10-CM | POA: Insufficient documentation

## 2020-10-31 DIAGNOSIS — Z7951 Long term (current) use of inhaled steroids: Secondary | ICD-10-CM | POA: Insufficient documentation

## 2020-10-31 DIAGNOSIS — I129 Hypertensive chronic kidney disease with stage 1 through stage 4 chronic kidney disease, or unspecified chronic kidney disease: Secondary | ICD-10-CM | POA: Insufficient documentation

## 2020-10-31 DIAGNOSIS — J45909 Unspecified asthma, uncomplicated: Secondary | ICD-10-CM | POA: Diagnosis not present

## 2020-10-31 DIAGNOSIS — E7800 Pure hypercholesterolemia, unspecified: Secondary | ICD-10-CM | POA: Diagnosis not present

## 2020-10-31 HISTORY — PX: ORIF MANDIBULAR FRACTURE: SHX2127

## 2020-10-31 HISTORY — PX: MANDIBULAR HARDWARE REMOVAL: SHX5205

## 2020-10-31 LAB — PREPARE RBC (CROSSMATCH)

## 2020-10-31 LAB — SARS CORONAVIRUS 2 BY RT PCR (HOSPITAL ORDER, PERFORMED IN ~~LOC~~ HOSPITAL LAB): SARS Coronavirus 2: NEGATIVE

## 2020-10-31 LAB — CBC WITH DIFFERENTIAL/PLATELET
Abs Immature Granulocytes: 0.04 10*3/uL (ref 0.00–0.07)
Basophils Absolute: 0 10*3/uL (ref 0.0–0.1)
Basophils Relative: 0 %
Eosinophils Absolute: 0.2 10*3/uL (ref 0.0–0.5)
Eosinophils Relative: 2 %
HCT: 27.7 % — ABNORMAL LOW (ref 39.0–52.0)
Hemoglobin: 8.4 g/dL — ABNORMAL LOW (ref 13.0–17.0)
Immature Granulocytes: 0 %
Lymphocytes Relative: 17 %
Lymphs Abs: 1.9 10*3/uL (ref 0.7–4.0)
MCH: 31.2 pg (ref 26.0–34.0)
MCHC: 30.3 g/dL (ref 30.0–36.0)
MCV: 103 fL — ABNORMAL HIGH (ref 80.0–100.0)
Monocytes Absolute: 0.6 10*3/uL (ref 0.1–1.0)
Monocytes Relative: 5 %
Neutro Abs: 8.6 10*3/uL — ABNORMAL HIGH (ref 1.7–7.7)
Neutrophils Relative %: 76 %
Platelets: 190 10*3/uL (ref 150–400)
RBC: 2.69 MIL/uL — ABNORMAL LOW (ref 4.22–5.81)
RDW: 18.6 % — ABNORMAL HIGH (ref 11.5–15.5)
WBC: 11.4 10*3/uL — ABNORMAL HIGH (ref 4.0–10.5)
nRBC: 0 % (ref 0.0–0.2)

## 2020-10-31 LAB — BASIC METABOLIC PANEL
Anion gap: 13 (ref 5–15)
BUN: 36 mg/dL — ABNORMAL HIGH (ref 8–23)
CO2: 16 mmol/L — ABNORMAL LOW (ref 22–32)
Calcium: 8 mg/dL — ABNORMAL LOW (ref 8.9–10.3)
Chloride: 111 mmol/L (ref 98–111)
Creatinine, Ser: 2.98 mg/dL — ABNORMAL HIGH (ref 0.61–1.24)
GFR, Estimated: 22 mL/min — ABNORMAL LOW (ref >60)
Glucose, Bld: 125 mg/dL — ABNORMAL HIGH (ref 70–99)
Potassium: 5.1 mmol/L (ref 3.5–5.1)
Sodium: 140 mmol/L (ref 135–145)

## 2020-10-31 LAB — ABO/RH: ABO/RH(D): O POS

## 2020-10-31 SURGERY — Surgical Case
Anesthesia: *Unknown

## 2020-10-31 SURGERY — OPEN REDUCTION INTERNAL FIXATION (ORIF) MANDIBULAR FRACTURE
Anesthesia: General | Site: Mouth

## 2020-10-31 MED ORDER — PHENYLEPHRINE HCL-NACL 10-0.9 MG/250ML-% IV SOLN
INTRAVENOUS | Status: DC | PRN
  Administered 2020-10-31: 150 ug/min via INTRAVENOUS

## 2020-10-31 MED ORDER — LACTATED RINGERS IV SOLN: INTRAVENOUS | Status: DC | PRN

## 2020-10-31 MED ORDER — ACETAMINOPHEN 10 MG/ML IV SOLN
INTRAVENOUS | Status: AC
  Filled 2020-10-31: qty 100

## 2020-10-31 MED ORDER — SUCCINYLCHOLINE 20MG/ML (10ML) SYRINGE FOR MEDFUSION PUMP - OPTIME
INTRAMUSCULAR | Status: DC | PRN
  Administered 2020-10-31: 160 mg via INTRAVENOUS

## 2020-10-31 MED ORDER — 0.9 % SODIUM CHLORIDE (POUR BTL) OPTIME
TOPICAL | Status: DC | PRN
  Administered 2020-10-31: 1000 mL

## 2020-10-31 MED ORDER — SODIUM CHLORIDE 0.9 % IV SOLN: INTRAVENOUS | Status: DC

## 2020-10-31 MED ORDER — DEXAMETHASONE SODIUM PHOSPHATE 10 MG/ML IJ SOLN
INTRAMUSCULAR | Status: DC | PRN
  Administered 2020-10-31: 5 mg via INTRAVENOUS

## 2020-10-31 MED ORDER — ORAL CARE MOUTH RINSE: 15.0000 mL | Freq: Once | OROMUCOSAL | Status: AC

## 2020-10-31 MED ORDER — CEFAZOLIN SODIUM-DEXTROSE 2-4 GM/100ML-% IV SOLN
2.0000 g | Freq: Two times a day (BID) | INTRAVENOUS | Status: DC
  Administered 2020-11-01: 2 g via INTRAVENOUS
  Filled 2020-10-31 (×2): qty 100

## 2020-10-31 MED ORDER — ACETAMINOPHEN 10 MG/ML IV SOLN
INTRAVENOUS | Status: DC | PRN
  Administered 2020-10-31: 1000 mg via INTRAVENOUS

## 2020-10-31 MED ORDER — ALBUTEROL SULFATE (2.5 MG/3ML) 0.083% IN NEBU
INHALATION_SOLUTION | RESPIRATORY_TRACT | Status: AC
  Filled 2020-10-31: qty 3

## 2020-10-31 MED ORDER — EPHEDRINE SULFATE 50 MG/ML IJ SOLN
INTRAMUSCULAR | Status: DC | PRN
  Administered 2020-10-31 (×3): 10 mg via INTRAVENOUS

## 2020-10-31 MED ORDER — ONDANSETRON HCL 4 MG/2ML IJ SOLN
INTRAMUSCULAR | Status: DC | PRN
  Administered 2020-10-31: 4 mg via INTRAVENOUS

## 2020-10-31 MED ORDER — ONDANSETRON HCL 4 MG/2ML IJ SOLN: 4.0000 mg | Freq: Once | INTRAMUSCULAR | Status: DC | PRN

## 2020-10-31 MED ORDER — ALBUMIN HUMAN 5 % IV SOLN: INTRAVENOUS | Status: DC | PRN

## 2020-10-31 MED ORDER — FENTANYL CITRATE (PF) 250 MCG/5ML IJ SOLN
INTRAMUSCULAR | Status: AC
  Filled 2020-10-31: qty 5

## 2020-10-31 MED ORDER — LIDOCAINE-EPINEPHRINE 1 %-1:100000 IJ SOLN
INTRAMUSCULAR | Status: DC | PRN
  Administered 2020-10-31: 2 mL

## 2020-10-31 MED ORDER — CEFAZOLIN SODIUM-DEXTROSE 1-4 GM/50ML-% IV SOLN: 1.0000 g | Freq: Two times a day (BID) | INTRAVENOUS | Status: DC

## 2020-10-31 MED ORDER — CEFAZOLIN SODIUM-DEXTROSE 2-3 GM-%(50ML) IV SOLR
INTRAVENOUS | Status: DC | PRN
  Administered 2020-10-31: 2 g via INTRAVENOUS

## 2020-10-31 MED ORDER — PROPOFOL 10 MG/ML IV BOLUS
INTRAVENOUS | Status: DC | PRN
  Administered 2020-10-31: 130 mg via INTRAVENOUS

## 2020-10-31 MED ORDER — EPHEDRINE SULFATE-NACL 50-0.9 MG/10ML-% IV SOSY
PREFILLED_SYRINGE | INTRAVENOUS | Status: DC | PRN
  Administered 2020-10-31: 10 mg via INTRAVENOUS

## 2020-10-31 MED ORDER — ALBUTEROL SULFATE (2.5 MG/3ML) 0.083% IN NEBU
2.5000 mg | INHALATION_SOLUTION | RESPIRATORY_TRACT | Status: DC | PRN
  Administered 2020-10-31: 2.5 mg via RESPIRATORY_TRACT

## 2020-10-31 MED ORDER — CHLORHEXIDINE GLUCONATE 0.12 % MT SOLN
15.0000 mL | Freq: Once | OROMUCOSAL | Status: AC
  Administered 2020-10-31: 15 mL via OROMUCOSAL
  Filled 2020-10-31: qty 15

## 2020-10-31 MED ORDER — METOPROLOL TARTRATE 25 MG PO TABS
25.0000 mg | ORAL_TABLET | Freq: Once | ORAL | Status: AC
  Administered 2020-10-31: 25 mg via ORAL
  Filled 2020-10-31: qty 1

## 2020-10-31 MED ORDER — SODIUM CHLORIDE 0.9 % IV SOLN: 250.0000 mL | INTRAVENOUS | Status: DC

## 2020-10-31 MED ORDER — FENTANYL CITRATE (PF) 100 MCG/2ML IJ SOLN
INTRAMUSCULAR | Status: DC | PRN
  Administered 2020-10-31: 150 ug via INTRAVENOUS

## 2020-10-31 MED ORDER — VASOPRESSIN 20 UNIT/ML IV SOLN
INTRAVENOUS | Status: DC | PRN
  Administered 2020-10-31 (×5): 1 [IU] via INTRAVENOUS

## 2020-10-31 MED ORDER — FENTANYL CITRATE (PF) 100 MCG/2ML IJ SOLN
INTRAMUSCULAR | Status: AC
  Filled 2020-10-31: qty 2

## 2020-10-31 MED ORDER — FENTANYL CITRATE (PF) 100 MCG/2ML IJ SOLN
25.0000 ug | INTRAMUSCULAR | Status: DC | PRN
Start: 2020-10-31 — End: 2020-10-31
  Administered 2020-10-31 (×4): 25 ug via INTRAVENOUS

## 2020-10-31 MED ORDER — PHENYLEPHRINE HCL (PRESSORS) 10 MG/ML IV SOLN
INTRAVENOUS | Status: DC | PRN
  Administered 2020-10-31: 80 ug via INTRAVENOUS
  Administered 2020-10-31: 120 ug via INTRAVENOUS

## 2020-10-31 MED ORDER — LIDOCAINE 2% (20 MG/ML) 5 ML SYRINGE
INTRAMUSCULAR | Status: DC | PRN
  Administered 2020-10-31: 100 mg via INTRAVENOUS

## 2020-10-31 MED ORDER — CEFAZOLIN SODIUM-DEXTROSE 2-4 GM/100ML-% IV SOLN
2.0000 g | INTRAVENOUS | Status: AC
  Administered 2020-10-31: 2 g via INTRAVENOUS
  Filled 2020-10-31: qty 100

## 2020-10-31 MED ORDER — NOREPINEPHRINE 4 MG/250ML-% IV SOLN
2.0000 ug/min | INTRAVENOUS | Status: DC
  Administered 2020-10-31: 4 ug/min via INTRAVENOUS
  Filled 2020-10-31: qty 250

## 2020-10-31 MED ORDER — VASOPRESSIN 20 UNIT/ML IV SOLN
INTRAVENOUS | Status: AC
  Filled 2020-10-31: qty 1

## 2020-10-31 MED ORDER — EPINEPHRINE PF 1 MG/ML IJ SOLN
INTRAMUSCULAR | Status: AC
  Filled 2020-10-31: qty 1

## 2020-10-31 MED ORDER — BACITRACIN ZINC 500 UNIT/GM EX OINT
TOPICAL_OINTMENT | CUTANEOUS | Status: AC
  Filled 2020-10-31: qty 28.35

## 2020-10-31 MED ORDER — LIDOCAINE-EPINEPHRINE 2 %-1:100000 IJ SOLN
INTRAMUSCULAR | Status: AC
  Filled 2020-10-31: qty 1

## 2020-10-31 MED ORDER — FENTANYL CITRATE (PF) 100 MCG/2ML IJ SOLN
12.5000 ug | INTRAMUSCULAR | Status: DC | PRN
  Administered 2020-10-31 – 2020-11-01 (×2): 12.5 ug via INTRAVENOUS
  Filled 2020-10-31 (×2): qty 2

## 2020-10-31 MED ORDER — HEMOSTATIC AGENTS (NO CHARGE) OPTIME
TOPICAL | Status: DC | PRN
  Administered 2020-10-31: 1 via TOPICAL

## 2020-10-31 MED ORDER — OXYMETAZOLINE HCL 0.05 % NA SOLN
NASAL | Status: AC
  Filled 2020-10-31: qty 30

## 2020-10-31 SURGICAL SUPPLY — 62 items
BIT DRILL TWIST 1.6X58MM (BIT) ×1 IMPLANT
BLADE RECIPRO TAPERED (BLADE) IMPLANT
BLADE SURG 10 STRL SS (BLADE) ×2 IMPLANT
BLADE SURG 15 STRL LF DISP TIS (BLADE) ×1 IMPLANT
BLADE SURG 15 STRL SS (BLADE) ×2
BUR CROSS CUT FISSURE 1.6 (BURR) ×2 IMPLANT
BUR RND DIAMOND ELITE 3.5 (BURR) IMPLANT
BUR SURG 4X8 MED (BURR) IMPLANT
BURR SURG 4X8 MED (BURR)
CANISTER SUCT 3000ML PPV (MISCELLANEOUS) ×2 IMPLANT
CLEANER TIP ELECTROSURG 2X2 (MISCELLANEOUS) ×2 IMPLANT
CLSR STERI-STRIP ANTIMIC 1/2X4 (GAUZE/BANDAGES/DRESSINGS) ×2 IMPLANT
COVER SURGICAL LIGHT HANDLE (MISCELLANEOUS) ×2 IMPLANT
COVER WAND RF STERILE (DRAPES) ×2 IMPLANT
DECANTER SPIKE VIAL GLASS SM (MISCELLANEOUS) ×2 IMPLANT
DERMABOND ADHESIVE PROPEN (GAUZE/BANDAGES/DRESSINGS) ×1
DERMABOND ADVANCED .7 DNX6 (GAUZE/BANDAGES/DRESSINGS) ×1 IMPLANT
DRAPE HALF SHEET 40X57 (DRAPES) IMPLANT
DRILL TWIST 1.6X58MM (BIT) ×2
ELECT COATED BLADE 2.86 ST (ELECTRODE) ×2 IMPLANT
ELECT NEEDLE BLADE 2-5/6 (NEEDLE) ×2 IMPLANT
ELECT REM PT RETURN 9FT ADLT (ELECTROSURGICAL) ×2
ELECTRODE REM PT RTRN 9FT ADLT (ELECTROSURGICAL) ×1 IMPLANT
GLOVE BIO SURGEON STRL SZ8 (GLOVE) ×2 IMPLANT
GLOVE BIOGEL PI IND STRL 8 (GLOVE) ×1 IMPLANT
GLOVE BIOGEL PI INDICATOR 8 (GLOVE) ×1
GOWN STRL REUS W/ TWL LRG LVL3 (GOWN DISPOSABLE) ×1 IMPLANT
GOWN STRL REUS W/ TWL XL LVL3 (GOWN DISPOSABLE) ×2 IMPLANT
GOWN STRL REUS W/TWL LRG LVL3 (GOWN DISPOSABLE) ×2
GOWN STRL REUS W/TWL XL LVL3 (GOWN DISPOSABLE) ×4
HOOK RETRACTION 12 ELAST STAY (MISCELLANEOUS) ×2 IMPLANT
KIT BASIN OR (CUSTOM PROCEDURE TRAY) ×2 IMPLANT
KIT TURNOVER KIT B (KITS) ×2 IMPLANT
LOCATOR NERVE 3 VOLT (DISPOSABLE) ×2 IMPLANT
NEEDLE HYPO 25GX1X1/2 BEV (NEEDLE) ×2 IMPLANT
NS IRRIG 1000ML POUR BTL (IV SOLUTION) ×2 IMPLANT
PAD ARMBOARD 7.5X6 YLW CONV (MISCELLANEOUS) ×4 IMPLANT
PENCIL BUTTON HOLSTER BLD 10FT (ELECTRODE) ×2 IMPLANT
PLATE RECON 2.5 11H (Plate) ×2 IMPLANT
POSITIONER HEAD DONUT 9IN (MISCELLANEOUS) ×2 IMPLANT
PROTECTOR CORNEAL (OPHTHALMIC RELATED) IMPLANT
SCISSORS WIRE ANG 4 3/4 DISP (INSTRUMENTS) IMPLANT
SCREW MNDBLE 2.0X10 LOCKING (Screw) ×4 IMPLANT
SCREW MNDBLE 2.0X6 LOCKING (Screw) ×4 IMPLANT
SUT CHROMIC 3 0 PS 2 (SUTURE) IMPLANT
SUT CHROMIC 4 0 PS 2 18 (SUTURE) IMPLANT
SUT ETHILON 5 0 P 3 18 (SUTURE)
SUT MNCRL AB 4-0 PS2 18 (SUTURE) ×2 IMPLANT
SUT NYLON ETHILON 5-0 P-3 1X18 (SUTURE) IMPLANT
SUT PROLENE 5 0 PS 2 (SUTURE) ×2 IMPLANT
SUT SILK 2 0 PERMA HAND 18 BK (SUTURE) IMPLANT
SUT STEEL 0 (SUTURE)
SUT STEEL 0 18XMFL TIE 17 (SUTURE) IMPLANT
SUT STEEL 4 (SUTURE) IMPLANT
SUT VIC AB 3-0 SH 27 (SUTURE) ×4
SUT VIC AB 3-0 SH 27X BRD (SUTURE) ×2 IMPLANT
SUT VIC AB 4-0 PS2 27 (SUTURE) ×2 IMPLANT
TOWEL GREEN STERILE FF (TOWEL DISPOSABLE) ×2 IMPLANT
TRAY ENT MC OR (CUSTOM PROCEDURE TRAY) ×2 IMPLANT
TRAY FOLEY W/BAG SLVR 16FR (SET/KITS/TRAYS/PACK) ×2
TRAY FOLEY W/BAG SLVR 16FR ST (SET/KITS/TRAYS/PACK) ×1 IMPLANT
WATER STERILE IRR 1000ML POUR (IV SOLUTION) ×2 IMPLANT

## 2020-10-31 NOTE — H&P (Signed)
Chad Mccall is an 69 y.o. male.   Chief Complaint: right mandibular osteomyelitis  HPI: 69yo M s/p debridement of right mandibular osteomyelitis with open reduction internal fixation right mandible. Now with failure of hardware and possible non-union presents for removal of hardware, further debridement and potential application of reconstruction plate. No acute infection currently, had completed course of IV antibiotics and PO regimen with infectious disease doctor.   Past Medical History:  Diagnosis Date  . Acute bronchitis   . Anemia   . Arthritis   . Asthma   . Calcium blood increased   . Chest pain   . Chronic pancreatitis ([place])   . Cough   . Depression    [facility], was put on Trazodone  . Dysrhythmia    fast heart rate, on Metoprolol, under control  . Esophageal reflux   . History of kidney stones   . Hyperlipidemia   . Hypertension   . Insomnia   . Nicotine dependence, cigarettes, uncomplicated   . PAD (peripheral artery disease) ([place])   . Pneumonia   . Polyp of colon    burst and lost a lot of blood  . Pre-diabetes    lost weight, and has not had any symptoms since. 2020 A1C was 5.3  . Pure hypercholesterolemia   . 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 CARDIAC CATHETERIZATION    . CHOLECYSTECTOMY    . CIRCUMCISION  2008  . COLONOSCOPY     to repair polyp  . DEBRIDEMENT MANDIBLE Right 08/20/2020   Procedure: DEBRIDEMENT MANDIBLE;  Surgeon: [name], [name];  Location: [place];  Service: Oral Surgery;  Laterality: Right;  . ORIF MANDIBULAR FRACTURE Right 08/20/2020   Procedure: OPEN REDUCTION INTERNAL FIXATION (ORIF) MANDIBULAR FRACTURE;  Surgeon: [name], [name];  Location: [place];  Service: Oral Surgery;  Laterality: Right;  . PANCREAS SURGERY      Family History  Problem Relation Age of Onset  . Heart attack Mother   . Alzheimer's disease Father   . Heart disease Sister   .  Hypertension Sister   . COPD Sister   . COPD Brother   . Hypertension Sister   . Arthritis Sister   . Hypertension Sister   . Heart failure Brother   . COPD Brother   . Heart failure Brother   . Heart failure Brother    Social History:  reports that he has been smoking cigarettes. He started smoking about 55 years ago. He has a 16.00 pack-year smoking history. He has never used smokeless tobacco. He reports current alcohol use. He reports that he does not use drugs.  Allergies:  Allergies  Allergen Reactions  . Codeine Hives    Palpitations   . Neosporin [Bacitracin-Polymyxin B] Itching    Medications Prior to Admission  Medication Sig Dispense Refill  . albuterol (VENTOLIN HFA) 108 (90 Base) MCG/ACT inhaler Inhale 2 puffs into the lungs 4 (four) times daily as needed for wheezing or shortness of breath.    Marland Kitchen amoxicillin-clavulanate (AUGMENTIN) 875-125 MG tablet Take 1 tablet by mouth 2 (two) times daily.    . benzonatate (TESSALON) 100 MG capsule Take 2 capsules by mouth 3 (three) times daily as needed for cough.  2  . chlorhexidine (PERIDEX) 0.12 % solution Use as directed 15 mLs in the mouth or throat 2 (two) times daily. (Patient taking differently: Use as directed 15 mLs in the  mouth or throat 2 (two) times daily as needed (mouth sores).) 120 mL 0  . chlorpheniramine (CHLOR-TRIMETON) 4 MG tablet Take 4 mg by mouth 2 (two) times daily as needed for allergies.    . cyclobenzaprine (FLEXERIL) 10 MG tablet Take 10 mg by mouth 3 (three) times daily as needed for muscle spasms.    Marland Kitchen dicyclomine (BENTYL) 10 MG capsule Take 10 mg by mouth 2 (two) times daily.    Marland Kitchen dutasteride (AVODART) 0.5 MG capsule Take 0.5 mg by mouth daily.    Marland Kitchen esomeprazole (NEXIUM) 20 MG capsule Take 20 mg by mouth at bedtime.    . fenofibrate 160 MG tablet Take 160 mg by mouth daily.    . fosinopril (MONOPRIL) 10 MG tablet Take 10 mg by mouth daily.     Marland Kitchen gabapentin (NEURONTIN) 300 MG capsule Take 300 mg by  mouth 2 (two) times daily. Afternoon and bedtime    . HYDROcodone-acetaminophen (NORCO/VICODIN) 5-325 MG tablet Take 1 tablet by mouth every 6 (six) hours as needed for pain.    Marland Kitchen ibuprofen (ADVIL) 200 MG tablet Take 400 mg by mouth every 6 (six) hours as needed for headache or mild pain.    . isosorbide mononitrate (IMDUR) 30 MG 24 hr tablet Take 30 mg by mouth 2 (two) times daily.     . meloxicam (MOBIC) 15 MG tablet Take 15 mg by mouth daily as needed for pain.    . metoprolol tartrate (LOPRESSOR) 50 MG tablet Take 25-50 mg by mouth See admin instructions. Take 50mg  in the morning & 25mg  in the evening    . nitroGLYCERIN (NITROSTAT) 0.4 MG SL tablet Place 1 tablet under the tongue every 5 (five) minutes x 3 doses as needed.    . ondansetron (ZOFRAN) 4 MG tablet Take 4 mg by mouth every 8 (eight) hours as needed for nausea or vomiting.    . pantoprazole (PROTONIX) 40 MG tablet Take 40 mg by mouth daily.    . Potassium 95 MG TABS Take 95 mg by mouth daily as needed (cramps).    . promethazine (PHENERGAN) 25 MG tablet Take 25 mg by mouth every 6 (six) hours as needed for nausea or vomiting.    . simvastatin (ZOCOR) 40 MG tablet Take 40 mg by mouth daily.    . temazepam (RESTORIL) 15 MG capsule Take 15 mg by mouth at bedtime.    Marland Kitchen terazosin (HYTRIN) 5 MG capsule Take 5 mg by mouth daily.    . traZODone (DESYREL) 50 MG tablet Take 50 mg by mouth at bedtime.    . triamcinolone cream (KENALOG) 0.1 % Apply 1 application topically 2 (two) times daily as needed (rash).    . traMADol (ULTRAM) 50 MG tablet Take 1 tablet (50 mg total) by mouth 3 (three) times daily as needed. (Patient taking differently: Take 50 mg by mouth 3 (three) times daily as needed for moderate pain.) 30 tablet 1    Results for orders placed or performed during the hospital encounter of 10/31/20 (from the past 48 hour(s))  SARS Coronavirus 2 by RT PCR (hospital order, performed in [facility] hospital lab) Nasopharyngeal  Nasopharyngeal Swab     Status: None   Collection Time: 10/31/20 10:28 AM   Specimen: Nasopharyngeal Swab  Result Value Ref Range   SARS Coronavirus 2 NEGATIVE NEGATIVE    Comment: (NOTE) SARS-CoV-2 target nucleic acids are NOT DETECTED.  The SARS-CoV-2 RNA is generally detectable in upper and lower respiratory specimens during the  acute phase of infection. The lowest concentration of SARS-CoV-2 viral copies this assay can detect is 250 copies / mL. A negative result does not preclude SARS-CoV-2 infection and should not be used as the sole basis for treatment or other patient management decisions.  A negative result may occur with improper specimen collection / handling, submission of specimen other than nasopharyngeal swab, presence of viral mutation(s) within the areas targeted by this assay, and inadequate number of viral copies (<250 copies / mL). A negative result must be combined with clinical observations, patient history, and epidemiological information.  Fact Sheet for Patients:   StrictlyIdeas.no  Fact Sheet for Healthcare Providers: BankingDealers.co.za  This test is not yet approved or  cleared by the [place] FDA and has been authorized for detection and/or diagnosis of SARS-CoV-2 by FDA under an Emergency Use Authorization (EUA).  This EUA will remain in effect (meaning this test can be used) for the duration of the COVID-19 declaration under Section 564(b)(1) of the Act, 21 U.S.C. section 360bbb-3(b)(1), unless the authorization is terminated or revoked sooner.  Performed at [facility], [place] [address] [place], [place] [address]   ABO/Rh     Status: None   Collection Time: 10/31/20 11:11 AM  Result Value Ref Range   ABO/RH(D)      O POS Performed at [facility] [address], [place], [place] [address]   CBC WITH DIFFERENTIAL     Status: Abnormal   Collection Time: 10/31/20 11:15 AM   Result Value Ref Range   WBC 11.4 (H) 4.0 - 10.5 K/uL   RBC 2.69 (L) 4.22 - 5.81 MIL/uL   Hemoglobin 8.4 (L) 13.0 - 17.0 g/dL   HCT 27.7 (L) 39.0 - 52.0 %   MCV 103.0 (H) 80.0 - 100.0 fL   MCH 31.2 26.0 - 34.0 pg   MCHC 30.3 30.0 - 36.0 g/dL   RDW 18.6 (H) 11.5 - 15.5 %   Platelets 190 150 - 400 K/uL   nRBC 0.0 0.0 - 0.2 %   Neutrophils Relative % 76 %   Neutro Abs 8.6 (H) 1.7 - 7.7 K/uL   Lymphocytes Relative 17 %   Lymphs Abs 1.9 0.7 - 4.0 K/uL   Monocytes Relative 5 %   Monocytes Absolute 0.6 0.1 - 1.0 K/uL   Eosinophils Relative 2 %   Eosinophils Absolute 0.2 0.0 - 0.5 K/uL   Basophils Relative 0 %   Basophils Absolute 0.0 0.0 - 0.1 K/uL   Immature Granulocytes 0 %   Abs Immature Granulocytes 0.04 0.00 - 0.07 K/uL    Comment: Performed at [facility], [address] [place], [place] [address]  Basic metabolic panel     Status: Abnormal   Collection Time: 10/31/20 11:15 AM  Result Value Ref Range   Sodium 140 135 - 145 mmol/L   Potassium 5.1 3.5 - 5.1 mmol/L    Comment: SLIGHT HEMOLYSIS   Chloride 111 98 - 111 mmol/L   CO2 16 (L) 22 - 32 mmol/L   Glucose, Bld 125 (H) 70 - 99 mg/dL    Comment: Glucose reference range applies only to samples taken after fasting for at least 8 hours.   BUN 36 (H) 8 - 23 mg/dL   Creatinine, Ser 2.98 (H) 0.61 - 1.24 mg/dL   Calcium 8.0 (L) 8.9 - 10.3 mg/dL   GFR, Estimated 22 (L) >60 mL/min    Comment: (NOTE) Calculated using the CKD-EPI Creatinine Equation (2021)  Anion gap 13 5 - 15    Comment: Performed at [facility] [address], [place], [place] [address]  Type and screen [place]     Status: None   Collection Time: 10/31/20 11:15 AM  Result Value Ref Range   ABO/RH(D) O POS    Antibody Screen NEG    Sample Expiration      11/03/2020,2359 Performed at [facility], [place] [address], [place], [place] [address]    No results found.  Review of Systems  Constitutional: Negative.    HENT: Positive for facial swelling.   Eyes: Negative.   Respiratory: Negative.   Cardiovascular: Negative.   Gastrointestinal: Negative.   Endocrine: Negative.   Genitourinary: Negative.   Skin: Negative.   Neurological: Negative.     Blood pressure (!) 90/55, pulse 81, temperature 97.6 F (36.4 C), temperature source Oral, resp. rate 18, height 5\' 4"  (1.626 m), weight 55.3 kg, SpO2 100 %. Physical Exam   Assessment/Plan Patient with right mandibular osteomyelitis s/p ORIF and debridement now with hardware failure requiring removal and potential reconstruction of right mandible. He has had no changes in medical history since his last surgery and no signs or symptoms of recent worsening cardiovascular disease or respiratory illness. No contraindications to proceeding with surgery as planned.   [name], [name] 10/31/2020, 1:34 PM

## 2020-10-31 NOTE — Consult Note (Signed)
Medical Consultation   Chad Mccall  JFH:[id number]  DOB: 13-Jan-1951  DOA: 10/31/2020  PCP: Patient, No Pcp Per Dr. Woody Seller  Outpatient Specialists:  Dr. Bronson Ing, cardiology  Dr. Lowanda Foster, nephrology   Requesting physician: Dr. Conley Simmonds  Reason for consultation: Management   History of Present Illness: Chad Mccall is an 69 y.o. male with history of CKD 4, peripheral vascular disease, CAD, diet-controlled type 2 diabetes, GERD, hypertension, dyslipidemia, insomnia, who presented to the hospital on 10/31/2020 for scheduled jaw surgery due to recurrent osteomyelitis.  The hospitalist service was contacted for medical management.  At time my exam, patient is slightly lethargic secondary to anesthesia.  He underwent debridement of right mandible and placement of reconstruction bar.    Patient has a long history of difficulties with his right mandible.  Initially he had a abscess inferior with antibiotics.  He then was lost to follow-up and returned several months later due to recurrent chronic pain and exposed bone of the right mandible.  Findings consistent with osteomyelitis.  He was referred to infectious disease and placed a PICC line and started on IV antibiotics.  He then scheduled for debridement and placement of reconstruction bar with oral surgery today.  During surgery he required some pressors.  However given surgery lasting longer, primary team wanted to follow overnight.  He says currently his pain is controlled.  He is complaining of dry mouth.  He does have some dyspnea.  Denies any recent changes in medications.  His blood pressure and chronic kidney disease have been stable.  He has been off aspirin for over a week.   Review of Systems:  ROS As per HPI otherwise 10 point review of systems negative.     Past Medical History: Past Medical History:  Diagnosis Date  . Acute bronchitis   . Anemia   . Arthritis   . Asthma   . Calcium blood  increased   . Chest pain   . Chronic pancreatitis ([place])   . Cough   . Depression    [facility], was put on Trazodone  . Dysrhythmia    fast heart rate, on Metoprolol, under control  . Esophageal reflux   . History of kidney stones   . Hyperlipidemia   . Hypertension   . Insomnia   . Nicotine dependence, cigarettes, uncomplicated   . PAD (peripheral artery disease) ([place])   . Pneumonia   . Polyp of colon    burst and lost a lot of blood  . Pre-diabetes    lost weight, and has not had any symptoms since. 2020 A1C was 5.3  . Pure hypercholesterolemia   . 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 CARDIAC CATHETERIZATION    . CHOLECYSTECTOMY    . CIRCUMCISION  2008  . COLONOSCOPY     to repair polyp  . DEBRIDEMENT MANDIBLE Right 08/20/2020   Procedure: DEBRIDEMENT MANDIBLE;  Surgeon: [name], [name];  Location: [place];  Service: Oral Surgery;  Laterality: Right;  . ORIF MANDIBULAR FRACTURE Right 08/20/2020   Procedure: OPEN REDUCTION INTERNAL FIXATION (ORIF) MANDIBULAR FRACTURE;  Surgeon: [name], [name];  Location: [place];  Service: Oral Surgery;  Laterality: Right;  . PANCREAS SURGERY       Allergies:   Allergies  Allergen Reactions  . Codeine Hives  Palpitations   . Neosporin [Bacitracin-Polymyxin B] Itching     Social History:  reports that he has been smoking cigarettes. He started smoking about 55 years ago. He has a 16.00 pack-year smoking history. He has never used smokeless tobacco. He reports current alcohol use. He reports that he does not use drugs.   Family History: Family History  Problem Relation Age of Onset  . Heart attack Mother   . Alzheimer's disease Father   . Heart disease Sister   . Hypertension Sister   . COPD Sister   . COPD Brother   . Hypertension Sister   . Arthritis Sister   . Hypertension Sister   . Heart failure Brother   . COPD  Brother   . Heart failure Brother   . Heart failure Brother    : Family history reviewed and not pertinent    Physical Exam: Vitals:   10/31/20 1755 10/31/20 1855 10/31/20 1955 10/31/20 2000  BP: (!) 115/50 (!) 124/57 136/61 (!) 143/64  Pulse: 98 95 99 99  Resp: 14 13 14    Temp: 97.9 F (36.6 C)  97.7 F (36.5 C) 97.7 F (36.5 C)  TempSrc:    Oral  SpO2: 95% 100% 98% 100%  Weight:      Height:        Constitutional:Alert and awake, oriented x3, not in any acute distress. Eyes: PERLA, EOMI, irises appear normal, anicteric sclera,  ENMT: external ears and nose appear normal, normal hearing           Neck: S/P dental surgery, dressing clean dry and intact CVS: S1-S2 clear, no murmur rubs or gallops, no LE edema, normal pedal pulses  Respiratory: Diminished breath sounds bilaterally, no wheezing, rales or rhonchi. Respiratory effort normal. No accessory muscle use.  Abdomen: soft nontender, nondistended, normal bowel sounds, no hepatosplenomegaly, no hernias  Musculoskeletal: : no cyanosis, clubbing or edema noted bilaterally           Neuro: Cranial nerves II-XII intact, strength, sensation, reflexes Psych: judgement and insight appear normal, stable mood and affect, mental status: Slightly lethargic likely due to anesthesia Skin: no rashes or lesions or ulcers, no induration or nodules   Data reviewed:  I have personally reviewed following labs and imaging studies Labs:  CBC: Recent Labs  Lab 10/31/20 1115  WBC 11.4*  NEUTROABS 8.6*  HGB 8.4*  HCT 27.7*  MCV 103.0*  PLT 767    Basic Metabolic Panel: Recent Labs  Lab 10/31/20 1115  NA 140  K 5.1  CL 111  CO2 16*  GLUCOSE 125*  BUN 36*  CREATININE 2.98*  CALCIUM 8.0*   GFR Estimated Creatinine Clearance: 17.8 mL/min (A) (by C-G formula based on SCr of 2.98 mg/dL (H)). Liver Function Tests: No results for input(s): AST, ALT, ALKPHOS, BILITOT, PROT, ALBUMIN in the last 168 hours. No results for input(s):  LIPASE, AMYLASE in the last 168 hours. No results for input(s): AMMONIA in the last 168 hours. Coagulation profile No results for input(s): INR, PROTIME in the last 168 hours.  Cardiac Enzymes: No results for input(s): CKTOTAL, CKMB, CKMBINDEX, TROPONINI in the last 168 hours. BNP: Invalid input(s): POCBNP CBG: No results for input(s): GLUCAP in the last 168 hours. D-Dimer No results for input(s): DDIMER in the last 72 hours. Hgb A1c No results for input(s): HGBA1C in the last 72 hours. Lipid Profile No results for input(s): CHOL, HDL, LDLCALC, TRIG, CHOLHDL, LDLDIRECT in the last 72 hours. Thyroid function studies No results  for input(s): TSH, T4TOTAL, T3FREE, THYROIDAB in the last 72 hours.  Invalid input(s): FREET3 Anemia work up No results for input(s): VITAMINB12, FOLATE, FERRITIN, TIBC, IRON, RETICCTPCT in the last 72 hours. Urinalysis No results found for: COLORURINE, APPEARANCEUR, [place], [place], [place], [place], [place], [place], [place], UROBILINOGEN, NITRITE, [place]   Microbiology Recent Results (from the past 240 hour(s))  SARS Coronavirus 2 by RT PCR (hospital order, performed in [facility] hospital lab) Nasopharyngeal Nasopharyngeal Swab     Status: None   Collection Time: 10/31/20 10:28 AM   Specimen: Nasopharyngeal Swab  Result Value Ref Range Status   SARS Coronavirus 2 NEGATIVE NEGATIVE Final    Comment: (NOTE) SARS-CoV-2 target nucleic acids are NOT DETECTED.  The SARS-CoV-2 RNA is generally detectable in upper and lower respiratory specimens during the acute phase of infection. The lowest concentration of SARS-CoV-2 viral copies this assay can detect is 250 copies / mL. A negative result does not preclude SARS-CoV-2 infection and should not be used as the sole basis for treatment or other patient management decisions.  A negative result may occur with improper specimen collection / handling, submission of specimen other than  nasopharyngeal swab, presence of viral mutation(s) within the areas targeted by this assay, and inadequate number of viral copies (<250 copies / mL). A negative result must be combined with clinical observations, patient history, and epidemiological information.  Fact Sheet for Patients:   StrictlyIdeas.no  Fact Sheet for Healthcare Providers: BankingDealers.co.za  This test is not yet approved or  cleared by the [place] FDA and has been authorized for detection and/or diagnosis of SARS-CoV-2 by FDA under an Emergency Use Authorization (EUA).  This EUA will remain in effect (meaning this test can be used) for the duration of the COVID-19 declaration under Section 564(b)(1) of the Act, 21 U.S.C. section 360bbb-3(b)(1), unless the authorization is terminated or revoked sooner.  Performed at [facility], [place] [address], [place], [place] [address]        Inpatient Medications:   Scheduled Meds: . albuterol       Continuous Infusions: . sodium chloride    . [START ON 11/01/2020]  ceFAZolin (ANCEF) IV    . norepinephrine (LEVOPHED) Adult infusion Stopped (10/31/20 1655)     Radiological Exams on Admission: No results found.  Impression/Recommendations Active Problems:   Surgery follow-up  1.  Osteomyelitis of right jaw, pathologic fracture of right mandibular angle -S/P debridement of right mandible and placement of reconstruction bar 10/31/2020 -Pain control per primary -Diet per primary -Discharge per primary -Continue scheduled Ancef for primary  2.  CKD 4 -Thought to be possibly secondary to recurrent kidney stones, NSAIDs, and/or polycystic kidney disease -Avoid further NSAIDs, IV contrast -Recommend low potassium, low phosphorus diet -Continue following with primary nephrologist  3.  Essential hypertension -Hold home BP medications due to soft BP after surgery  4.  COPD -Continue home breathing  regimen  5.  Anemia -Likely related to CKD with underlying anemia of chronic disease/inflammation -No transfusion warranted at this time, continue close monitoring, follow-up labs ordered   Thank you for this consultation.  Our [facility] hospitalist team will follow the patient with you.   Time Spent: 43 minutes spent on consultant   Doran Heater M.D. [facility] 10/31/2020, 9:14 PM

## 2020-10-31 NOTE — Anesthesia Preprocedure Evaluation (Signed)
Anesthesia Evaluation  Patient identified by MRN, date of birth, ID band Patient awake    Reviewed: Allergy & Precautions, NPO status , Patient's Chart, lab work & pertinent test results  Airway Mallampati: I  TM Distance: >3 FB Neck ROM: Full    Dental   Pulmonary Current Smoker,    Pulmonary exam normal        Cardiovascular hypertension, Pt. on medications Normal cardiovascular exam     Neuro/Psych Depression    GI/Hepatic GERD  Medicated and Controlled,  Endo/Other    Renal/GU      Musculoskeletal   Abdominal   Peds  Hematology   Anesthesia Other Findings   Reproductive/Obstetrics                             Anesthesia Physical Anesthesia Plan  ASA: III  Anesthesia Plan: General   Post-op Pain Management:    Induction: Intravenous  PONV Risk Score and Plan: 1 and Ondansetron  Airway Management Planned: Oral ETT  Additional Equipment:   Intra-op Plan:   Post-operative Plan: Extubation in OR  Informed Consent: I have reviewed the patients History and Physical, chart, labs and discussed the procedure including the risks, benefits and alternatives for the proposed anesthesia with the patient or authorized representative who has indicated his/her understanding and acceptance.       Plan Discussed with: CRNA and Surgeon  Anesthesia Plan Comments:         Anesthesia Quick Evaluation

## 2020-10-31 NOTE — Anesthesia Procedure Notes (Signed)
Procedure Name: Intubation Performed by: Cleda Daub, CRNA Pre-anesthesia Checklist: Patient identified, Emergency Drugs available, Suction available and Patient being monitored Patient Re-evaluated:Patient Re-evaluated prior to induction Oxygen Delivery Method: Circle system utilized Preoxygenation: Pre-oxygenation with 100% oxygen Induction Type: IV induction Ventilation: Mask ventilation without difficulty Laryngoscope Size: Mac and 3 Grade View: Grade I Tube type: Oral Rae (per surgeon.) Tube size: 7.5 mm Number of attempts: 1 Airway Equipment and Method: Stylet and Oral airway Placement Confirmation: ETT inserted through vocal cords under direct vision,  positive ETCO2 and breath sounds checked- equal and bilateral Secured at: 21 cm Tube secured with: Tape Dental Injury: Teeth and Oropharynx as per pre-operative assessment

## 2020-10-31 NOTE — Progress Notes (Signed)
[facility] Nurse reports patient with severe jaw pain and no current meds to address. Advised to notify provider on call for surgical service as the primary service.  RN informs me he is unable to get in contact with on call service. Fentanyl was ordered to address pain

## 2020-10-31 NOTE — Transfer of Care (Signed)
Immediate Anesthesia Transfer of Care Note  Patient: Chad Mccall  Procedure(s) Performed: OPEN REDUCTION INTERNAL FIXATION (ORIF) MANDIBULAR FRACTURE (N/A Mouth) MANDIBULAR HARDWARE REMOVAL (N/A Mouth)  Patient Location: PACU  Anesthesia Type:General  Level of Consciousness: awake, alert , oriented and patient cooperative  Airway & Oxygen Therapy: Patient Spontanous Breathing and Patient connected to face mask oxygen  Post-op Assessment: Report given to RN and Post -op Vital signs reviewed and stable  Post vital signs: Reviewed and stable  Last Vitals:  Vitals Value Taken Time  BP 142/57 10/31/20 1704  Temp 36.5 C 10/31/20 1655  Pulse 89 10/31/20 1706  Resp 15 10/31/20 1706  SpO2 97 % 10/31/20 1706  Vitals shown include unvalidated device data.  Last Pain:  Vitals:   10/31/20 1038  TempSrc:   PainSc: 3       Patients Stated Pain Goal: 3 (81/82/99 3716)  Complications: No complications documented.

## 2020-11-01 DIAGNOSIS — Z7951 Long term (current) use of inhaled steroids: Secondary | ICD-10-CM | POA: Diagnosis not present

## 2020-11-01 DIAGNOSIS — Z20822 Contact with and (suspected) exposure to covid-19: Secondary | ICD-10-CM | POA: Diagnosis not present

## 2020-11-01 DIAGNOSIS — J45909 Unspecified asthma, uncomplicated: Secondary | ICD-10-CM | POA: Diagnosis not present

## 2020-11-01 DIAGNOSIS — F17210 Nicotine dependence, cigarettes, uncomplicated: Secondary | ICD-10-CM | POA: Diagnosis not present

## 2020-11-01 DIAGNOSIS — I129 Hypertensive chronic kidney disease with stage 1 through stage 4 chronic kidney disease, or unspecified chronic kidney disease: Secondary | ICD-10-CM | POA: Diagnosis not present

## 2020-11-01 DIAGNOSIS — I2510 Atherosclerotic heart disease of native coronary artery without angina pectoris: Secondary | ICD-10-CM | POA: Diagnosis not present

## 2020-11-01 DIAGNOSIS — N184 Chronic kidney disease, stage 4 (severe): Secondary | ICD-10-CM | POA: Diagnosis not present

## 2020-11-01 DIAGNOSIS — M8448XK Pathological fracture, other site, subsequent encounter for fracture with nonunion: Secondary | ICD-10-CM | POA: Diagnosis not present

## 2020-11-01 DIAGNOSIS — E119 Type 2 diabetes mellitus without complications: Secondary | ICD-10-CM | POA: Diagnosis not present

## 2020-11-01 LAB — TYPE AND SCREEN
ABO/RH(D): O POS
Antibody Screen: NEGATIVE
Unit division: 0
Unit division: 0

## 2020-11-01 LAB — COMPREHENSIVE METABOLIC PANEL
ALT: 12 U/L (ref 0–44)
AST: 27 U/L (ref 15–41)
Albumin: 3 g/dL — ABNORMAL LOW (ref 3.5–5.0)
Alkaline Phosphatase: 41 U/L (ref 38–126)
Anion gap: 14 (ref 5–15)
BUN: 31 mg/dL — ABNORMAL HIGH (ref 8–23)
CO2: 13 mmol/L — ABNORMAL LOW (ref 22–32)
Calcium: 6.9 mg/dL — ABNORMAL LOW (ref 8.9–10.3)
Chloride: 112 mmol/L — ABNORMAL HIGH (ref 98–111)
Creatinine, Ser: 2.44 mg/dL — ABNORMAL HIGH (ref 0.61–1.24)
GFR, Estimated: 28 mL/min — ABNORMAL LOW (ref >60)
Glucose, Bld: 152 mg/dL — ABNORMAL HIGH (ref 70–99)
Potassium: 3.8 mmol/L (ref 3.5–5.1)
Sodium: 139 mmol/L (ref 135–145)
Total Bilirubin: 0.8 mg/dL (ref 0.3–1.2)
Total Protein: 6 g/dL — ABNORMAL LOW (ref 6.5–8.1)

## 2020-11-01 LAB — MAGNESIUM: Magnesium: 0.7 mg/dL — CL (ref 1.7–2.4)

## 2020-11-01 LAB — CBC WITH DIFFERENTIAL/PLATELET
Abs Immature Granulocytes: 0.05 10*3/uL (ref 0.00–0.07)
Abs Immature Granulocytes: 0.04 10*3/uL (ref 0.00–0.07)
Basophils Absolute: 0 10*3/uL (ref 0.0–0.1)
Basophils Absolute: 0 10*3/uL (ref 0.0–0.1)
Basophils Relative: 0 %
Basophils Relative: 0 %
Eosinophils Absolute: 0 10*3/uL (ref 0.0–0.5)
Eosinophils Absolute: 0 10*3/uL (ref 0.0–0.5)
Eosinophils Relative: 0 %
Eosinophils Relative: 0 %
HCT: 26 % — ABNORMAL LOW (ref 39.0–52.0)
HCT: 25.3 % — ABNORMAL LOW (ref 39.0–52.0)
Hemoglobin: 8.4 g/dL — ABNORMAL LOW (ref 13.0–17.0)
Hemoglobin: 7.9 g/dL — ABNORMAL LOW (ref 13.0–17.0)
Immature Granulocytes: 1 %
Immature Granulocytes: 0 %
Lymphocytes Relative: 7 %
Lymphocytes Relative: 11 %
Lymphs Abs: 0.6 10*3/uL — ABNORMAL LOW (ref 0.7–4.0)
Lymphs Abs: 1.2 10*3/uL (ref 0.7–4.0)
MCH: 32.6 pg (ref 26.0–34.0)
MCH: 31.2 pg (ref 26.0–34.0)
MCHC: 32.3 g/dL (ref 30.0–36.0)
MCHC: 31.2 g/dL (ref 30.0–36.0)
MCV: 100.8 fL — ABNORMAL HIGH (ref 80.0–100.0)
MCV: 100 fL (ref 80.0–100.0)
Monocytes Absolute: 0.2 10*3/uL (ref 0.1–1.0)
Monocytes Absolute: 0.6 10*3/uL (ref 0.1–1.0)
Monocytes Relative: 2 %
Monocytes Relative: 6 %
Neutro Abs: 7.1 10*3/uL (ref 1.7–7.7)
Neutro Abs: 8.3 10*3/uL — ABNORMAL HIGH (ref 1.7–7.7)
Neutrophils Relative %: 90 %
Neutrophils Relative %: 83 %
Platelets: 129 10*3/uL — ABNORMAL LOW (ref 150–400)
Platelets: 137 10*3/uL — ABNORMAL LOW (ref 150–400)
RBC: 2.58 MIL/uL — ABNORMAL LOW (ref 4.22–5.81)
RBC: 2.53 MIL/uL — ABNORMAL LOW (ref 4.22–5.81)
RDW: 17.7 % — ABNORMAL HIGH (ref 11.5–15.5)
RDW: 17.6 % — ABNORMAL HIGH (ref 11.5–15.5)
WBC: 8 10*3/uL (ref 4.0–10.5)
WBC: 10.1 10*3/uL (ref 4.0–10.5)
nRBC: 0 % (ref 0.0–0.2)
nRBC: 0 % (ref 0.0–0.2)

## 2020-11-01 LAB — BASIC METABOLIC PANEL
Anion gap: 12 (ref 5–15)
BUN: 24 mg/dL — ABNORMAL HIGH (ref 8–23)
CO2: 14 mmol/L — ABNORMAL LOW (ref 22–32)
Calcium: 6.8 mg/dL — ABNORMAL LOW (ref 8.9–10.3)
Chloride: 111 mmol/L (ref 98–111)
Creatinine, Ser: 2.11 mg/dL — ABNORMAL HIGH (ref 0.61–1.24)
GFR, Estimated: 33 mL/min — ABNORMAL LOW (ref >60)
Glucose, Bld: 109 mg/dL — ABNORMAL HIGH (ref 70–99)
Potassium: 3.6 mmol/L (ref 3.5–5.1)
Sodium: 137 mmol/L (ref 135–145)

## 2020-11-01 LAB — BPAM RBC
Blood Product Expiration Date: 202201252359
Blood Product Expiration Date: 202201262359
ISSUE DATE / TIME: 202112221554
ISSUE DATE / TIME: 202112222043
Unit Type and Rh: 5100
Unit Type and Rh: 5100

## 2020-11-01 LAB — PHOSPHORUS: Phosphorus: 3.9 mg/dL (ref 2.5–4.6)

## 2020-11-01 MED ORDER — CALCIUM CARBONATE 1250 (500 CA) MG PO TABS
1.0000 | ORAL_TABLET | Freq: Once | ORAL | Status: AC
  Administered 2020-11-01: 500 mg via ORAL
  Filled 2020-11-01: qty 1

## 2020-11-01 MED ORDER — OXYCODONE-ACETAMINOPHEN 5-325 MG PO TABS
1.0000 | ORAL_TABLET | ORAL | Status: DC | PRN
  Administered 2020-11-01: 1 via ORAL
  Filled 2020-11-01: qty 1

## 2020-11-01 MED ORDER — MAGNESIUM SULFATE IN D5W 1-5 GM/100ML-% IV SOLN
1.0000 g | Freq: Once | INTRAVENOUS | Status: AC
  Administered 2020-11-01: 1 g via INTRAVENOUS
  Filled 2020-11-01: qty 100

## 2020-11-01 MED ORDER — MAGNESIUM SULFATE 50 % IJ SOLN: 1.0000 g | Freq: Once | INTRAMUSCULAR | Status: DC

## 2020-11-01 NOTE — Progress Notes (Signed)
Pt received @ unit around 2000. No orders found for pt when he arrived In unit. Pt was complaining of jaw pain and requesting metoprolol for his hand tremors. Contacted [facility] on call. Was instructed to call attending. Called attending answering services x2 with no response. Able to attain order from NP [facility] for PRN pain medication and metoprolol 25mg . Blood pressure stable and not dropping

## 2020-11-01 NOTE — Discharge Instructions (Signed)
Maintain liquid diet.  Ice to face for next 48 hours Peridex rinses twice daily Avoid contact to face, sleep on left side or back.  Ok to shower, do not submerge head in water for next 7 days

## 2020-11-01 NOTE — Brief Op Note (Signed)
10/31/2020  6:32 AM  PATIENT:  Chad Mccall  69 y.o. male  PRE-OPERATIVE DIAGNOSIS:  OSTEOMYOLITIS RIGHT MANDIBAL  POST-OPERATIVE DIAGNOSIS:  OSTEOMYOLITIS RIGHT MANDIBAL  PROCEDURE:  Procedure(s): OPEN REDUCTION INTERNAL FIXATION (ORIF) MANDIBULAR FRACTURE (N/A) MANDIBULAR HARDWARE REMOVAL (N/A)  SURGEON:  Surgeon(s) and Role:    * [name], [name] - Primary    * Lakyn Alsteen, Conner J, [name] - Assisting  PHYSICIAN ASSISTANT:   ASSISTANTS: none   ANESTHESIA:   general  EBL:  100 mL   BLOOD ADMINISTERED:1u CC PRBC  DRAINS: none   LOCAL MEDICATIONS USED:  LIDOCAINE   SPECIMEN:  No Specimen  DISPOSITION OF SPECIMEN:  N/A  COUNTS:  YES  TOURNIQUET:  * No tourniquets in log *  DICTATION: .Note written in EPIC  PLAN OF CARE: Admit for overnight observation  PATIENT DISPOSITION:  PACU - hemodynamically stable.   Delay start of Pharmacological VTE agent (>24hrs) due to surgical blood loss or risk of bleeding: yes

## 2020-11-01 NOTE — Progress Notes (Signed)
Discharge summary packet provided to pt/spouse with instructions . Patient/spouse verbalized understanding of instructions with no complaints voiced. Pt D/C to home as ordered. Pt remains alert/oriented in no apparent distress.

## 2020-11-02 LAB — POCT I-STAT EG7
Acid-base deficit: 14 mmol/L — ABNORMAL HIGH (ref 0.0–2.0)
Bicarbonate: 13.3 mmol/L — ABNORMAL LOW (ref 20.0–28.0)
Calcium, Ion: 1.05 mmol/L — ABNORMAL LOW (ref 1.15–1.40)
HCT: 25 % — ABNORMAL LOW (ref 39.0–52.0)
Hemoglobin: 8.5 g/dL — ABNORMAL LOW (ref 13.0–17.0)
O2 Saturation: 83 %
Potassium: 4.4 mmol/L (ref 3.5–5.1)
Sodium: 144 mmol/L (ref 135–145)
TCO2: 14 mmol/L — ABNORMAL LOW (ref 22–32)
pCO2, Ven: 34.7 mmHg — ABNORMAL LOW (ref 44.0–60.0)
pH, Ven: 7.192 — CL (ref 7.250–7.430)
pO2, Ven: 57 mmHg — ABNORMAL HIGH (ref 32.0–45.0)

## 2020-11-02 NOTE — Anesthesia Postprocedure Evaluation (Signed)
Anesthesia Post Note  Patient: Chad Mccall  Procedure(s) Performed: OPEN REDUCTION INTERNAL FIXATION (ORIF) MANDIBULAR FRACTURE (N/A Mouth) MANDIBULAR HARDWARE REMOVAL (N/A Mouth)     Patient location during evaluation: PACU Anesthesia Type: General Level of consciousness: sedated and patient cooperative Pain management: pain level controlled Vital Signs Assessment: post-procedure vital signs reviewed and stable Respiratory status: spontaneous breathing Cardiovascular status: stable Anesthetic complications: no   No complications documented.  Last Vitals:  Vitals:   11/01/20 0348 11/01/20 0906  BP: 132/62 (!) 134/54  Pulse: 93 96  Resp: 15 18  Temp: 37 C 36.6 C  SpO2: 98% 99%    Last Pain:  Vitals:   11/01/20 0908  TempSrc:   PainSc: 0-No pain                 Nolon Nations

## 2020-11-04 ENCOUNTER — Encounter (HOSPITAL_COMMUNITY): Payer: Self-pay | Admitting: Oral Surgery

## 2020-11-06 DIAGNOSIS — Z23 Encounter for immunization: Secondary | ICD-10-CM | POA: Diagnosis not present

## 2020-11-06 DIAGNOSIS — Z1339 Encounter for screening examination for other mental health and behavioral disorders: Secondary | ICD-10-CM | POA: Diagnosis not present

## 2020-11-06 DIAGNOSIS — Z7189 Other specified counseling: Secondary | ICD-10-CM | POA: Diagnosis not present

## 2020-11-06 DIAGNOSIS — I739 Peripheral vascular disease, unspecified: Secondary | ICD-10-CM | POA: Diagnosis not present

## 2020-11-06 DIAGNOSIS — Z79899 Other long term (current) drug therapy: Secondary | ICD-10-CM | POA: Diagnosis not present

## 2020-11-06 DIAGNOSIS — Z125 Encounter for screening for malignant neoplasm of prostate: Secondary | ICD-10-CM | POA: Diagnosis not present

## 2020-11-06 DIAGNOSIS — E7800 Pure hypercholesterolemia, unspecified: Secondary | ICD-10-CM | POA: Diagnosis not present

## 2020-11-06 DIAGNOSIS — I10 Essential (primary) hypertension: Secondary | ICD-10-CM | POA: Diagnosis not present

## 2020-11-06 DIAGNOSIS — Z0000 Encounter for general adult medical examination without abnormal findings: Secondary | ICD-10-CM | POA: Diagnosis not present

## 2020-11-06 DIAGNOSIS — R6884 Jaw pain: Secondary | ICD-10-CM | POA: Diagnosis not present

## 2020-11-06 DIAGNOSIS — R5383 Other fatigue: Secondary | ICD-10-CM | POA: Diagnosis not present

## 2020-11-06 DIAGNOSIS — Z1331 Encounter for screening for depression: Secondary | ICD-10-CM | POA: Diagnosis not present

## 2020-11-07 NOTE — Discharge Summary (Signed)
Physician Discharge Summary  Patient ID: Chad Mccall MRN: [id number] DOB/AGE: 04-21-1951 69 y.o.  Admit date: 10/31/2020 Discharge date: 11/07/2020  Admission Diagnoses: Nonunion right mandibular pathologic fracture  Discharge Diagnoses:  Segmental defect right mandible CKD Stage 4 PVD CAD DMII Anemia  Active Problems:   Surgery follow-up   Discharged Condition: good  Hospital Course: The patient was taken to the operating room for removal of hardware right mandible with resection of non-union and application of reconstruction plate. He was transfused 1 unit of pRBC and required vasopressors while under anesthesia but was successfully weaned in the PACU. He was admitted overnight for observation and remained hemodynamically stable and discharged on POD1.   Consults: None  Significant Diagnostic Studies: none  Treatments: surgery: removal of hardware right mandible, application of reconstruction plate  Discharge Exam: Blood pressure (!) 134/54, pulse 96, temperature 97.9 F (36.6 C), temperature source Oral, resp. rate 18, height 5\' 4"  (1.626 m), weight 55.3 kg, SpO2 99 %. Maxillofacial Exam:  Moderate right lower 1/3 edema VII weakness on the right side Incision c/d/i  Disposition: Discharge disposition: 01-Home or Self Care       Discharge Instructions    Call MD / Call 911   Complete by: As directed    If you experience chest pain or shortness of breath, CALL 911 and be transported to the hospital emergency room.  If you develope a fever above 101 F, pus (white drainage) or increased drainage or redness at the wound, or calf pain, call your surgeon's office.   Constipation Prevention   Complete by: As directed    Drink plenty of fluids.  Prune juice may be helpful.  You may use a stool softener, such as Colace (over the counter) 100 mg twice a day.  Use MiraLax (over the counter) for constipation as needed.   Discharge diet:   Complete by: As directed     Full liquid diet   Increase activity slowly as tolerated   Complete by: As directed      Allergies as of 11/01/2020      Reactions   Codeine Hives   Palpitations   Neosporin [bacitracin-polymyxin B] Itching      Medication List    STOP taking these medications   HYDROcodone-acetaminophen 5-325 MG tablet Commonly known as: NORCO/VICODIN     TAKE these medications   albuterol 108 (90 Base) MCG/ACT inhaler Commonly known as: VENTOLIN HFA Inhale 2 puffs into the lungs 4 (four) times daily as needed for wheezing or shortness of breath.   amoxicillin-clavulanate 875-125 MG tablet Commonly known as: AUGMENTIN Take 1 tablet by mouth 2 (two) times daily.   benzonatate 100 MG capsule Commonly known as: TESSALON Take 2 capsules by mouth 3 (three) times daily as needed for cough.   chlorhexidine 0.12 % solution Commonly known as: PERIDEX Use as directed 15 mLs in the mouth or throat 2 (two) times daily. What changed:   when to take this  reasons to take this   chlorpheniramine 4 MG tablet Commonly known as: CHLOR-TRIMETON Take 4 mg by mouth 2 (two) times daily as needed for allergies.   cyclobenzaprine 10 MG tablet Commonly known as: FLEXERIL Take 10 mg by mouth 3 (three) times daily as needed for muscle spasms.   dicyclomine 10 MG capsule Commonly known as: BENTYL Take 10 mg by mouth 2 (two) times daily.   dutasteride 0.5 MG capsule Commonly known as: AVODART Take 0.5 mg by mouth daily.   esomeprazole 20  MG capsule Commonly known as: NEXIUM Take 20 mg by mouth at bedtime.   fenofibrate 160 MG tablet Take 160 mg by mouth daily.   fosinopril 10 MG tablet Commonly known as: MONOPRIL Take 10 mg by mouth daily.   gabapentin 300 MG capsule Commonly known as: NEURONTIN Take 300 mg by mouth 2 (two) times daily. Afternoon and bedtime   ibuprofen 200 MG tablet Commonly known as: ADVIL Take 400 mg by mouth every 6 (six) hours as needed for headache or mild pain.    isosorbide mononitrate 30 MG 24 hr tablet Commonly known as: IMDUR Take 30 mg by mouth 2 (two) times daily.   meloxicam 15 MG tablet Commonly known as: MOBIC Take 15 mg by mouth daily as needed for pain.   metoprolol tartrate 50 MG tablet Commonly known as: LOPRESSOR Take 25-50 mg by mouth See admin instructions. Take 50mg  in the morning & 25mg  in the evening   nitroGLYCERIN 0.4 MG SL tablet Commonly known as: NITROSTAT Place 1 tablet under the tongue every 5 (five) minutes x 3 doses as needed.   ondansetron 4 MG tablet Commonly known as: ZOFRAN Take 4 mg by mouth every 8 (eight) hours as needed for nausea or vomiting.   pantoprazole 40 MG tablet Commonly known as: PROTONIX Take 40 mg by mouth daily.   Potassium 95 MG Tabs Take 95 mg by mouth daily as needed (cramps).   promethazine 25 MG tablet Commonly known as: PHENERGAN Take 25 mg by mouth every 6 (six) hours as needed for nausea or vomiting.   simvastatin 40 MG tablet Commonly known as: ZOCOR Take 40 mg by mouth daily.   temazepam 15 MG capsule Commonly known as: RESTORIL Take 15 mg by mouth at bedtime.   terazosin 5 MG capsule Commonly known as: HYTRIN Take 5 mg by mouth daily.   traMADol 50 MG tablet Commonly known as: ULTRAM Take 1 tablet (50 mg total) by mouth 3 (three) times daily as needed. What changed: reasons to take this   traZODone 50 MG tablet Commonly known as: DESYREL Take 50 mg by mouth at bedtime.   triamcinolone 0.1 % Commonly known as: KENALOG Apply 1 application topically 2 (two) times daily as needed (rash).       Follow-up Information    Zelphia Cairo P, [name] In 1 week.   Specialty: Oral Surgery Contact information: [place] [address] [phone]               Signed: [name] 11/07/2020, 9:04 AM

## 2020-11-07 NOTE — Op Note (Signed)
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 Zelphia Cairo, Dr. Terese Door  PROCEDURE: 1.Removal of hardware 2. Resection fibrotic tissue and bony margins 3. Application of reconstruction plate  COMPLICATIONS: none  DISPOSITION: PACU  INDICATIONS FOR PROCEDURE: This is a 69 y.o.with PMH significant for CKD 4, peripheral vascular disease, CAD, diet-controlled type 2 diabetes, GERD, hypertension, dyslipidemia, insomnia who initially developed right sided mandibular osteomyelitis several months ago. He has compelted a course of IV antibiotics post-operatively and a repeat CT scan indicated resolution of the infection and no ongoing bony erosion, but failed hardware and non-union of the fracture. He presents today for removal of hardware and potential open reduction internal fixation of the fracture.   DESCRIPTION OF PROCEDURE: After the patient was brought to the operating room, placed supine on the operating room table, and successfully inducedvia nasotracheal intubationby the anesthesia team, a total of 2cc of 1% lidocaine with 1:100k epi was injected along the previous incision in a subcutaneous plane.  The inferior border of the mandible was marked and planned incision line about 2cm below the inferior border of the mandible overt the previous incision line. An incision was made with a #15 blade through skin and subcutaneous tissue along the previously marked incision line. Bovie electrocautery was then utilized to obtain hemostasis and continue dissection through platysma to reach the superficial layer of deep cervical fascia. Dissection continued with tonsils and Bovie electrocautery, with continuous nerve stimulation during dissection. The marginal mandibular nerve was  encountered and avoided duringthedissection. The facial vein was encountered, ligated, and divided. Dissection continued superiorly just deep to the vein and superficial to the capsule of the submandibular gland until the inferior border of the mandible was identified.At this time, the pterygomasseteric sling was sharply incised and periosteal elevators were used to elevate the periosteum from the angle of the mandible to the parasymphysis toexpose the defect and reconstruction plate, which was found to be mobile in the distal segment. The remaining screws were removed and the plate was removed.  Dissection continued to remove granulation tissue and fibrous adhesions. During this process, the patient had developed significant hypotension requiring vasopressor support to maintain a MAP of 60. The decision was made to resect the non-union margins and place a temporary reconstruction plate. This was completed with a fissue bur and a 2.16mm Stryker reconstruction plate was bent and adapted to the mandible, and secured with 2 bicortical screws on each side of the defect, which now measured about 1.5cm. The site was irrigated with NS and closure was obtained in layersafter a valsalva maneuver was completed and no hemorrhage was noted.The pterygomasseteric sling reapproximated with 4-0 Vicryl, then the plasysma with3-0 Vicrly, then deep dermal and subcuticular sutures with 4-0 Monocryl.Dermabond was then placed on the skin.    The patient was turned back over to the care of the anesthesia team who successfully extubated the patient, transported the patient to PACU for recovery. The patient remained on norepinephrine until he reached the PACU when he was successfully weaned from pressors.    POST-OPERATIVE PLANS:The patient was admitted to the hospitalist service for observation overnight with plans for discharge the following morning. We will evaluate for need for bone grafting in a period of 4-6 weeks.

## 2020-11-08 DIAGNOSIS — I10 Essential (primary) hypertension: Secondary | ICD-10-CM | POA: Diagnosis not present

## 2020-11-08 DIAGNOSIS — E7800 Pure hypercholesterolemia, unspecified: Secondary | ICD-10-CM | POA: Diagnosis not present

## 2020-11-08 DIAGNOSIS — F329 Major depressive disorder, single episode, unspecified: Secondary | ICD-10-CM | POA: Diagnosis not present

## 2020-12-10 DIAGNOSIS — G629 Polyneuropathy, unspecified: Secondary | ICD-10-CM | POA: Diagnosis not present

## 2020-12-10 DIAGNOSIS — E785 Hyperlipidemia, unspecified: Secondary | ICD-10-CM | POA: Diagnosis not present

## 2021-01-07 DIAGNOSIS — G629 Polyneuropathy, unspecified: Secondary | ICD-10-CM | POA: Diagnosis not present

## 2021-01-07 DIAGNOSIS — E785 Hyperlipidemia, unspecified: Secondary | ICD-10-CM | POA: Diagnosis not present

## 2021-01-22 DIAGNOSIS — I25119 Atherosclerotic heart disease of native coronary artery with unspecified angina pectoris: Secondary | ICD-10-CM | POA: Diagnosis not present

## 2021-01-22 DIAGNOSIS — F322 Major depressive disorder, single episode, severe without psychotic features: Secondary | ICD-10-CM | POA: Diagnosis not present

## 2021-01-22 DIAGNOSIS — R6884 Jaw pain: Secondary | ICD-10-CM | POA: Diagnosis not present

## 2021-01-22 DIAGNOSIS — F17210 Nicotine dependence, cigarettes, uncomplicated: Secondary | ICD-10-CM | POA: Diagnosis not present

## 2021-01-22 DIAGNOSIS — Z299 Encounter for prophylactic measures, unspecified: Secondary | ICD-10-CM | POA: Diagnosis not present

## 2021-01-22 DIAGNOSIS — I10 Essential (primary) hypertension: Secondary | ICD-10-CM | POA: Diagnosis not present

## 2021-01-22 DIAGNOSIS — I739 Peripheral vascular disease, unspecified: Secondary | ICD-10-CM | POA: Diagnosis not present

## 2021-07-17 NOTE — Progress Notes (Deleted)
Cardiology Office Note  Date: 07/17/2021   ID: Chad Mccall, DOB [birth year], MRN [id number]  PCP:  Patient, No Pcp Per (Inactive)  Cardiologist:  Kate Sable, MD (Inactive) Electrophysiologist:  None   Chief Complaint: Preop clearance  History of Present Illness: Chad Mccall is a 70 y.o. male with a history of PAD, HLD, HTN, dysrhythmia, prediabetes, stage IV renal disease, GERD,  right carotid bruit, tobacco abuse.  He was last seen by Dr. Bronson Ing on 11/08/2018.  He did undergone a normal nuclear stress test on 08/30/2018.  Carotid Dopplers on 08/12/2018 demonstrated 1 to 39% bilateral internal carotid artery stenosis.  Since his prior visit with Dr. Bronson Ing he had an intestinal bleeding and requiring hospitalization and underwent a colonoscopy at that time.  He continued with occasional mild chest pain but nothing severe.  He was found to have CKD stage IV after basic metabolic panel.  His PCP was arranging for him to see nephrology.  He denied any palpitations or exertional dyspnea.  He denied any orthopnea or leg swelling.  He was continuing beta-blockers and long-acting nitrates by PCP he was continuing statin therapy.  His blood pressure was controlled on present therapy.   Past Medical History:  Diagnosis Date   Acute bronchitis    Anemia    Arthritis    Asthma    Calcium blood increased    Chest pain    Chronic pancreatitis ([place])    Cough    Depression    [facility], was put on Trazodone   Dysrhythmia    fast heart rate, on Metoprolol, under control   Esophageal reflux    History of kidney stones    Hyperlipidemia    Hypertension    Insomnia    Nicotine dependence, cigarettes, uncomplicated    PAD (peripheral artery disease) (HCC)    Pneumonia    Polyp of colon    burst and lost a lot of blood   Pre-diabetes    lost weight, and has not had any symptoms since. 2020 A1C was 5.3   Pure hypercholesterolemia    Renal failure 10/2011    Stage 4    Past Surgical History:  Procedure Laterality Date   CARDIAC CATHETERIZATION     CHOLECYSTECTOMY     CIRCUMCISION  2008   COLONOSCOPY     to repair polyp   DEBRIDEMENT MANDIBLE Right 08/20/2020   Procedure: DEBRIDEMENT MANDIBLE;  Surgeon: [name], [name];  Location: [place];  Service: Oral Surgery;  Laterality: Right;   MANDIBULAR HARDWARE REMOVAL N/A 10/31/2020   Procedure: MANDIBULAR HARDWARE REMOVAL;  Surgeon: [name], [name];  Location: [place];  Service: Oral Surgery;  Laterality: N/A;   ORIF MANDIBULAR FRACTURE Right 08/20/2020   Procedure: OPEN REDUCTION INTERNAL FIXATION (ORIF) MANDIBULAR FRACTURE;  Surgeon: [name], [name];  Location: [place];  Service: Oral Surgery;  Laterality: Right;   ORIF MANDIBULAR FRACTURE N/A 10/31/2020   Procedure: OPEN REDUCTION INTERNAL FIXATION (ORIF) MANDIBULAR FRACTURE;  Surgeon: [name], [name];  Location: [place];  Service: Oral Surgery;  Laterality: N/A;   PANCREAS SURGERY      Current Outpatient Medications  Medication Sig Dispense Refill   albuterol (VENTOLIN HFA) 108 (90 Base) MCG/ACT inhaler Inhale 2 puffs into the lungs 4 (four) times daily as needed for wheezing or shortness of breath.     amoxicillin-clavulanate (AUGMENTIN) 875-125 MG tablet Take 1 tablet by mouth 2 (two) times daily.  benzonatate (TESSALON) 100 MG capsule Take 2 capsules by mouth 3 (three) times daily as needed for cough.  2   chlorhexidine (PERIDEX) 0.12 % solution Use as directed 15 mLs in the mouth or throat 2 (two) times daily. (Patient taking differently: Use as directed 15 mLs in the mouth or throat 2 (two) times daily as needed (mouth sores).) 120 mL 0   chlorpheniramine (CHLOR-TRIMETON) 4 MG tablet Take 4 mg by mouth 2 (two) times daily as needed for allergies.     cyclobenzaprine (FLEXERIL) 10 MG tablet Take 10 mg by mouth 3 (three) times daily as needed for muscle spasms.     dicyclomine (BENTYL) 10 MG capsule Take 10 mg by  mouth 2 (two) times daily.     dutasteride (AVODART) 0.5 MG capsule Take 0.5 mg by mouth daily.     esomeprazole (NEXIUM) 20 MG capsule Take 20 mg by mouth at bedtime.     fenofibrate 160 MG tablet Take 160 mg by mouth daily.     fosinopril (MONOPRIL) 10 MG tablet Take 10 mg by mouth daily.      gabapentin (NEURONTIN) 300 MG capsule Take 300 mg by mouth 2 (two) times daily. Afternoon and bedtime     ibuprofen (ADVIL) 200 MG tablet Take 400 mg by mouth every 6 (six) hours as needed for headache or mild pain.     isosorbide mononitrate (IMDUR) 30 MG 24 hr tablet Take 30 mg by mouth 2 (two) times daily.      meloxicam (MOBIC) 15 MG tablet Take 15 mg by mouth daily as needed for pain.     metoprolol tartrate (LOPRESSOR) 50 MG tablet Take 25-50 mg by mouth See admin instructions. Take '50mg'$  in the morning & '25mg'$  in the evening     nitroGLYCERIN (NITROSTAT) 0.4 MG SL tablet Place 1 tablet under the tongue every 5 (five) minutes x 3 doses as needed.     ondansetron (ZOFRAN) 4 MG tablet Take 4 mg by mouth every 8 (eight) hours as needed for nausea or vomiting.     pantoprazole (PROTONIX) 40 MG tablet Take 40 mg by mouth daily.     Potassium 95 MG TABS Take 95 mg by mouth daily as needed (cramps).     promethazine (PHENERGAN) 25 MG tablet Take 25 mg by mouth every 6 (six) hours as needed for nausea or vomiting.     simvastatin (ZOCOR) 40 MG tablet Take 40 mg by mouth daily.     temazepam (RESTORIL) 15 MG capsule Take 15 mg by mouth at bedtime.     terazosin (HYTRIN) 5 MG capsule Take 5 mg by mouth daily.     traMADol (ULTRAM) 50 MG tablet Take 1 tablet (50 mg total) by mouth 3 (three) times daily as needed. (Patient taking differently: Take 50 mg by mouth 3 (three) times daily as needed for moderate pain.) 30 tablet 1   traZODone (DESYREL) 50 MG tablet Take 50 mg by mouth at bedtime.     triamcinolone cream (KENALOG) 0.1 % Apply 1 application topically 2 (two) times daily as needed (rash).     No current  facility-administered medications for this visit.   Allergies:  Codeine and Neosporin [bacitracin-polymyxin b]   Social History: The patient  reports that he has been smoking cigarettes. He started smoking about 55 years ago. He has a 16.00 pack-year smoking history. He has never used smokeless tobacco. He reports current alcohol use. He reports that he does not use drugs.  Family History: The patient's family history includes Alzheimer's disease in his father; Arthritis in his sister; COPD in his brother, brother, and sister; Heart attack in his mother; Heart disease in his sister; Heart failure in his brother, brother, and brother; Hypertension in his sister, sister, and sister.   ROS:  Please see the history of present illness. Otherwise, complete review of systems is positive for {NONE DEFAULTED:18576}.  All other systems are reviewed and negative.   Physical Exam: VS:  There were no vitals taken for this visit., BMI There is no height or weight on file to calculate BMI.  Wt Readings from Last 3 Encounters:  10/31/20 122 lb (55.3 kg)  08/20/20 123 lb 14.4 oz (56.2 kg)  08/17/20 123 lb 14.4 oz (56.2 kg)    General: Patient appears comfortable at rest. HEENT: Conjunctiva and lids normal, oropharynx clear with moist mucosa. Neck: Supple, no elevated JVP or carotid bruits, no thyromegaly. Lungs: Clear to auscultation, nonlabored breathing at rest. Cardiac: Regular rate and rhythm, no S3 or significant systolic murmur, no pericardial rub. Abdomen: Soft, nontender, no hepatomegaly, bowel sounds present, no guarding or rebound. Extremities: No pitting edema, distal pulses 2+. Skin: Warm and dry. Musculoskeletal: No kyphosis. Neuropsychiatric: Alert and oriented x3, affect grossly appropriate.  ECG:  {EKG/Telemetry Strips Reviewed:[phone]}  Recent Labwork: 11/01/2020: ALT 12; AST 27; BUN 24; Creatinine, Ser 2.11; Hemoglobin 7.9; Magnesium 0.7; Platelets 137; Potassium 3.6; Sodium 137   No results found for: CHOL, TRIG, HDL, CHOLHDL, VLDL, LDLCALC, LDLDIRECT  Other Studies Reviewed Today:  Nuclear stress test 08/30/2018  Narrative & Impression  There was no ST segment deviation noted during stress. The study is normal. This is a low risk study. Nuclear stress EF: 61%.     Carotid artery duplex study 08/12/2018 Final Interpretation:  Right Carotid: Velocities in the right ICA are consistent with a 1-39%  stenosis.                 The ECA appears >50% stenosed.   Left Carotid: Velocities in the left ICA are consistent with a 1-39%  stenosis.   Vertebrals:  Bilateral vertebral arteries demonstrate antegrade flow.  Subclavians: Normal flow hemodynamics were seen in bilateral subclavian               arteries.   *See table(s) above for measurements and observations.  Assessment and Plan:  1. History of chest pain   2. Essential hypertension   3. Mixed hyperlipidemia   4. Tobacco abuse   5. Chronic kidney disease (CKD), stage IV (severe) (HCC)      Medication Adjustments/Labs and Tests Ordered: Current medicines are reviewed at length with the patient today.  Concerns regarding medicines are outlined above.   Disposition: Follow-up with ***  Signed, Levell July, NP 07/17/2021 4:47 PM    [facility] at [facility] [place], [place], [place] [address] Phone: [phone]; Fax: [phone]

## 2021-07-18 ENCOUNTER — Ambulatory Visit: Payer: Medicare PPO | Admitting: Family Medicine

## 2021-07-18 DIAGNOSIS — E782 Mixed hyperlipidemia: Secondary | ICD-10-CM

## 2021-07-18 DIAGNOSIS — I10 Essential (primary) hypertension: Secondary | ICD-10-CM

## 2021-07-18 DIAGNOSIS — N184 Chronic kidney disease, stage 4 (severe): Secondary | ICD-10-CM

## 2021-07-18 DIAGNOSIS — Z87898 Personal history of other specified conditions: Secondary | ICD-10-CM

## 2021-07-18 DIAGNOSIS — Z720 Tobacco use: Secondary | ICD-10-CM

## 2021-08-29 NOTE — Progress Notes (Signed)
Cardiology Office Note  Date: 08/30/2021   ID: Chad Mccall, DOB 02-15-1951, MRN [id number]  PCP:  Glenda Chroman, MD  Cardiologist:   Electrophysiologist:  None   Chief Complaint: Preop clearance  History of Present Illness: Chad Mccall is a 70 y.o. male with a history of chest pain, right carotid bruit, HTN, HLD, tobacco abuse, CKD stage IV.  He was seen by Dr. Bronson Ing on 11/08/2018 after undergoing cardiac testing for evaluation of chest pain.  Underwent a normal nuclear stress test on 08/30/2018.  Carotid Dopplers demonstrated 1 to 39% bilateral internal stenosis 08/12/2018.  Since last visit with Dr. Bronson Ing he had an intestinal bleed requiring hospitalization.  Underwent a colonoscopy.  He continues to have occasional mild chest pain but nothing severe.  He haschronic kidney disease stage IV.  His PCP was arranging for him to see a nephrologist.  His stress test was normal.  No further cardiac testing was indicated.  He was on beta-blockers and long-acting nitrates prescribed by PCP in addition to statin therapy.  His blood pressure was controlled on present therapy with no changes.  He was currently on statin therapy for hyperlipidemia.  He had smoked 1 pack of cigarettes daily since age of 63.  He was to follow-up as needed  He is here today for preop clearance to undergo mandibular surgery at [facility].  He states surgeon stated he needed a stress test for clearance to undergo surgery.  He has a history of chest pain of uncertain etiology with previous normal stress test October 2019.  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  Previous intestinal bleed requiring hospitalization and underwent previous colonoscopy.  No current GI bleeding.  He was last seen by Dr. Bronson Ing in October 2019 and continued to have occasional mild chest pain but nothing severe.  Today he denies any issues with chest pain.  He does have a chronic kidney disease  stage IV with most recent creatinine at 2.9 on lab work from [facility] with GFR of 22.  He denies any current anginal or exertional symptoms.  He continues to smoke.  He states he has smoked from age 51.  Blood pressure is elevated today but he states he has not taken his antihypertensive medication today.  He denies any orthostatic symptoms, CVA or TIA-like symptoms, palpitations or arrhythmias, PND, orthopnea, bleeding, claudication, DVT or PE-like symptoms, or lower extremity edema.   Past Medical History:  Diagnosis Date   Acute bronchitis    Anemia    Arthritis    Asthma    Calcium blood increased    Chest pain    Chronic pancreatitis ([place])    Cough    Depression    [facility], was put on Trazodone   Dysrhythmia    fast heart rate, on Metoprolol, under control   Esophageal reflux    History of kidney stones    Hyperlipidemia    Hypertension    Insomnia    Nicotine dependence, cigarettes, uncomplicated    PAD (peripheral artery disease) (HCC)    Pneumonia    Polyp of colon    burst and lost a lot of blood   Pre-diabetes    lost weight, and has not had any symptoms since. 2020 A1C was 5.3   Pure hypercholesterolemia    Renal failure 10/2011   Stage 4    Past Surgical History:  Procedure Laterality Date   CARDIAC CATHETERIZATION  CHOLECYSTECTOMY     CIRCUMCISION  2008   COLONOSCOPY     to repair polyp   DEBRIDEMENT MANDIBLE Right 08/20/2020   Procedure: DEBRIDEMENT MANDIBLE;  Surgeon: [name], [name];  Location: [place];  Service: Oral Surgery;  Laterality: Right;   MANDIBULAR HARDWARE REMOVAL N/A 10/31/2020   Procedure: MANDIBULAR HARDWARE REMOVAL;  Surgeon: [name], [name];  Location: [place];  Service: Oral Surgery;  Laterality: N/A;   ORIF MANDIBULAR FRACTURE Right 08/20/2020   Procedure: OPEN REDUCTION INTERNAL FIXATION (ORIF) MANDIBULAR FRACTURE;  Surgeon: [name], [name];  Location: [place];  Service: Oral Surgery;  Laterality: Right;    ORIF MANDIBULAR FRACTURE N/A 10/31/2020   Procedure: OPEN REDUCTION INTERNAL FIXATION (ORIF) MANDIBULAR FRACTURE;  Surgeon: [name], [name];  Location: [place];  Service: Oral Surgery;  Laterality: N/A;   PANCREAS SURGERY      Current Outpatient Medications  Medication Sig Dispense Refill   albuterol (VENTOLIN HFA) 108 (90 Base) MCG/ACT inhaler Inhale 2 puffs into the lungs 4 (four) times daily as needed for wheezing or shortness of breath.     benzonatate (TESSALON) 100 MG capsule Take 2 capsules by mouth 3 (three) times daily as needed for cough.  2   chlorhexidine (PERIDEX) 0.12 % solution Use as directed 15 mLs in the mouth or throat 2 (two) times daily. (Patient taking differently: Use as directed 15 mLs in the mouth or throat 2 (two) times daily as needed (mouth sores).) 120 mL 0   chlorpheniramine (CHLOR-TRIMETON) 4 MG tablet Take 4 mg by mouth 2 (two) times daily as needed for allergies.     cyclobenzaprine (FLEXERIL) 10 MG tablet Take 10 mg by mouth 3 (three) times daily as needed for muscle spasms.     dicyclomine (BENTYL) 10 MG capsule Take 10 mg by mouth 2 (two) times daily.     dutasteride (AVODART) 0.5 MG capsule Take 0.5 mg by mouth daily.     esomeprazole (NEXIUM) 20 MG capsule Take 20 mg by mouth at bedtime.     fenofibrate 160 MG tablet Take 160 mg by mouth daily.     fosinopril (MONOPRIL) 10 MG tablet Take 10 mg by mouth daily.      gabapentin (NEURONTIN) 300 MG capsule Take 300 mg by mouth 2 (two) times daily. Afternoon and bedtime     ibuprofen (ADVIL) 200 MG tablet Take 400 mg by mouth every 6 (six) hours as needed for headache or mild pain.     isosorbide mononitrate (IMDUR) 30 MG 24 hr tablet Take 30 mg by mouth 2 (two) times daily.      meloxicam (MOBIC) 15 MG tablet Take 15 mg by mouth daily as needed for pain.     metoprolol tartrate (LOPRESSOR) 50 MG tablet Take 25-50 mg by mouth See admin instructions. Take '50mg'$  in the morning & '25mg'$  in the evening      nitroGLYCERIN (NITROSTAT) 0.4 MG SL tablet Place 1 tablet under the tongue every 5 (five) minutes x 3 doses as needed.     ondansetron (ZOFRAN) 4 MG tablet Take 4 mg by mouth every 8 (eight) hours as needed for nausea or vomiting.     pantoprazole (PROTONIX) 40 MG tablet Take 40 mg by mouth daily.     Potassium 95 MG TABS Take 95 mg by mouth daily as needed (cramps).     promethazine (PHENERGAN) 25 MG tablet Take 25 mg by mouth every 6 (six) hours as needed for nausea  or vomiting.     simvastatin (ZOCOR) 40 MG tablet Take 40 mg by mouth daily.     temazepam (RESTORIL) 15 MG capsule Take 15 mg by mouth at bedtime.     terazosin (HYTRIN) 5 MG capsule Take 5 mg by mouth daily.     traMADol (ULTRAM) 50 MG tablet Take 1 tablet (50 mg total) by mouth 3 (three) times daily as needed. (Patient taking differently: Take 50 mg by mouth 3 (three) times daily as needed for moderate pain.) 30 tablet 1   traZODone (DESYREL) 50 MG tablet Take 50 mg by mouth at bedtime.     triamcinolone cream (KENALOG) 0.1 % Apply 1 application topically 2 (two) times daily as needed (rash).     No current facility-administered medications for this visit.   Allergies:  Codeine and Neosporin [bacitracin-polymyxin b]   Social History: The patient  reports that he has been smoking cigarettes. He started smoking about 56 years ago. He has a 16.00 pack-year smoking history. He has never used smokeless tobacco. He reports current alcohol use. He reports that he does not use drugs.   Family History: The patient's family history includes Alzheimer's disease in his father; Arthritis in his sister; COPD in his brother, brother, and sister; Heart attack in his mother; Heart disease in his sister; Heart failure in his brother, brother, and brother; Hypertension in his sister, sister, and sister.   ROS:  Please see the history of present illness. Otherwise, complete review of systems is positive for none.  All other systems are reviewed and  negative.   Physical Exam: VS:  BP (!) 166/90   Pulse 60   Ht '5\' 4"'$  (1.626 m)   Wt 131 lb 12.8 oz (59.8 kg)   SpO2 96%   BMI 22.62 kg/m , BMI Body mass index is 22.62 kg/m.  Wt Readings from Last 3 Encounters:  08/30/21 131 lb 12.8 oz (59.8 kg)  10/31/20 122 lb (55.3 kg)  08/20/20 123 lb 14.4 oz (56.2 kg)    General: Patient appears comfortable at rest. Neck: Supple, no elevated JVP or carotid bruits, no thyromegaly. Lungs: Clear to auscultation, nonlabored breathing at rest. Cardiac: Regular rate and rhythm, no S3 or significant systolic murmur, no pericardial rub. Extremities: No pitting edema, distal pulses 2+. Skin: Warm and dry. Musculoskeletal: No kyphosis. Neuropsychiatric: Alert and oriented x3, affect grossly appropriate.  ECG: 08/30/2021 normal sinus rhythm rate of 80, left anterior fascicular block.  Recent Labwork: 11/01/2020: ALT 12; AST 27; BUN 24; Creatinine, Ser 2.11; Hemoglobin 7.9; Magnesium 0.7; Platelets 137; Potassium 3.6; Sodium 137  No results found for: CHOL, TRIG, HDL, CHOLHDL, VLDL, LDLCALC, LDLDIRECT  Other Studies Reviewed Today:  Lexiscan Myoview 08/30/2018 Study Result  Narrative & Impression  There was no ST segment deviation noted during stress. The study is normal. This is a low risk study. Nuclear stress EF: 61%.    Carotid artery duplex study 08/12/2018 Final Interpretation:  Right Carotid: Velocities in the right ICA are consistent with a 1-39%  stenosis.                 The ECA appears >50% stenosed.   Left Carotid: Velocities in the left ICA are consistent with a 1-39%  stenosis.   Vertebrals:  Bilateral vertebral arteries demonstrate antegrade flow.  Subclavians: Normal flow hemodynamics were seen in bilateral subclavian               arteries.  Echocardiogram 06/21/2018. Left ventricle: Estimated LV  ejection fraction normal at 70 to 75%, LV chamber size normal.  LV wall thickness mildly increased, evidence for left  ventricular diastolic dysfunction. Right ventricle: There is normal right ventricular size, wall dimension, systolic function. Left atrium, LA chamber size normal, LA diam is 3.04 cm, LA volume index of wrist is A-L space biplane) is 14.83 mm/m.Marland Kitchen  LA volume index is within normal limits.  (Less than 29 mm/m). Right atrium: RA chamber size normal. Aortic valve: There is normal aortic valve structure and function. Mitral valve: Normal mitral valve structure and function.  There is minimal mitral regurgitation.  No mitral stenosis. Tricuspid valve: There is minimal tricuspid regurgitation.  There is no tricuspid stenosis.  Estimated RVSP is 18.48 mmHg.  Right atrial pressure is 5.0 mmHg. Pulmonary: There is normal pulmonic valve structure and function.  There is no pulmonic valve stenosis or regurgitation. Pericardial: Pericardium is normal. Aorta: Size of the visualized portion of the aortic root is within normal limits.  Aortic root diameter is 2.81 cm. Veinous: Inferior vena cava dimension is normal     Assessment and Plan:  1. Preoperative clearance   2. Chest pain of uncertain etiology   3. Essential hypertension   4. Mixed hyperlipidemia   5. Bruit of right carotid artery   6. Tobacco abuse   7. Chronic kidney disease (CKD), stage IV (severe) ([place])    1.  Preoperative clearance Patient is pending mandibular surgery at [facility].  He states surgery is requiring him to undergo a stress test to risk stratify him for surgery.  His revised cardiac risk index score is 1 placing him at a perioperative risk of major cardiac adverse event at 0.9%.  His [facility] activity status index equals 53.7 giving him a functional capacity and METS at 9.34.  From a cardiac standpoint patient is cleared to undergo mandibular surgery under general anesthesia if stress test is determined to be low risk.  Please get a Lexiscan Myoview stress test.  2.  Chest pain of uncertain etiology Low risk Lexiscan Myoview in  October 2019 denies any current anginal or exertional symptoms.  As noted above we are ordering a Lexiscan Myoview for risk stratification to undergo mandibular surgery at [facility].  Continue sublingual nitroglycerin as needed, continue Imdur 30 mg p.o. daily  4.  Essential hypertension Pressure is elevated today at 166/90.  Patient has not taken any of his antihypertensive medications today.  He states she is a little stressed out this morning because he got up late and had to hurry to get here.  He states his blood pressure is usually within normal limits at home.  I advised him the goal for his blood pressure would be 130/80 consistently.  Continue fosinopril 10 mg daily, continue metoprolol 50 mg a.m. and 25 mg p.m.  5.  Mixed hyperlipidemia Continue simvastatin 40 mg p.o. daily.  Continue fenofibrate 160 mg p.o. daily.  6.  Bruit of right carotid artery Carotid artery duplex 08/12/2018 bilateral 1 to 39% ICA stenosis.  Right right ECA with greater 50% stenosis.  Bilateral vertebrals are with antegrade flow.  Normal flow hemodynamics in bilateral subclavian arteries.  7.  Tobacco abuse Continues to smoke.  He states he smoked since the age of 77.  68.  Chronic kidney disease (CKD), stage IV (severe) ([place]) Recent lab work from [facility] on 07/02/2021 creatinine at 29 and GFR of 22.  Medication Adjustments/Labs and Tests Ordered: Current medicines are reviewed at length with the patient today.  Concerns regarding  medicines are outlined above.   Disposition: Follow-up with Dr. Harl Bowie or APP as needed.  Signed, Levell July, NP 08/30/2021 9:19 AM    [facility] at [place], [place],  [address] Phone: [phone]; Fax: [phone]

## 2021-08-30 ENCOUNTER — Encounter: Payer: Self-pay | Admitting: *Deleted

## 2021-08-30 ENCOUNTER — Telehealth: Payer: Self-pay | Admitting: Family Medicine

## 2021-08-30 ENCOUNTER — Other Ambulatory Visit: Payer: Self-pay

## 2021-08-30 ENCOUNTER — Encounter: Payer: Self-pay | Admitting: Family Medicine

## 2021-08-30 ENCOUNTER — Ambulatory Visit: Payer: Medicare Other | Admitting: Family Medicine

## 2021-08-30 VITALS — BP 166/90 | HR 60 | Ht 64.0 in | Wt 131.8 lb

## 2021-08-30 DIAGNOSIS — I10 Essential (primary) hypertension: Secondary | ICD-10-CM | POA: Diagnosis not present

## 2021-08-30 DIAGNOSIS — R0989 Other specified symptoms and signs involving the circulatory and respiratory systems: Secondary | ICD-10-CM

## 2021-08-30 DIAGNOSIS — Z01810 Encounter for preprocedural cardiovascular examination: Secondary | ICD-10-CM

## 2021-08-30 DIAGNOSIS — E782 Mixed hyperlipidemia: Secondary | ICD-10-CM | POA: Diagnosis not present

## 2021-08-30 DIAGNOSIS — R079 Chest pain, unspecified: Secondary | ICD-10-CM

## 2021-08-30 DIAGNOSIS — Z720 Tobacco use: Secondary | ICD-10-CM

## 2021-08-30 DIAGNOSIS — N184 Chronic kidney disease, stage 4 (severe): Secondary | ICD-10-CM

## 2021-08-30 DIAGNOSIS — Z01818 Encounter for other preprocedural examination: Secondary | ICD-10-CM

## 2021-08-30 NOTE — Telephone Encounter (Signed)
Checking percert on the following patient for testing sched at [facility].  lEXISCAN   09/06/2021

## 2021-08-30 NOTE — Patient Instructions (Signed)
Medication Instructions:  Your physician recommends that you continue on your current medications as directed. Please refer to the Current Medication list given to you today.   Labwork: none  Testing/Procedures: Your physician has requested that you have a lexiscan myoview. For further information please visit [email or web address]. Please follow instruction sheet, as given.    Follow-Up: Your physician recommends that you schedule a follow-up appointment in: as needed    Any Other Special Instructions Will Be Listed Below (If Applicable).     If you need a refill on your cardiac medications before your next appointment, please call your pharmacy.   

## 2021-09-06 ENCOUNTER — Encounter (HOSPITAL_COMMUNITY): Payer: Medicare Other

## 2021-09-06 ENCOUNTER — Encounter (HOSPITAL_COMMUNITY)
Admission: RE | Admit: 2021-09-06 | Discharge: 2021-09-06 | Disposition: A | Discharge status: Home / Self Care | Payer: Medicare Other | Source: Ambulatory Visit | Attending: Family Medicine | Admitting: Family Medicine

## 2021-09-06 ENCOUNTER — Other Ambulatory Visit: Payer: Self-pay

## 2021-09-06 ENCOUNTER — Encounter (HOSPITAL_COMMUNITY): Payer: Self-pay

## 2021-09-06 DIAGNOSIS — Z01810 Encounter for preprocedural cardiovascular examination: Secondary | ICD-10-CM | POA: Diagnosis not present

## 2021-09-06 DIAGNOSIS — Z01818 Encounter for other preprocedural examination: Secondary | ICD-10-CM | POA: Diagnosis present

## 2021-09-06 DIAGNOSIS — R079 Chest pain, unspecified: Secondary | ICD-10-CM | POA: Insufficient documentation

## 2021-09-06 LAB — NM MYOCAR MULTI W/SPECT W/WALL MOTION / EF
LV dias vol: 102 mL (ref 62–150)
LV sys vol: 42 mL
Nuc Stress EF: 59 %
Peak HR: 90 {beats}/min
RATE: 0.6
Rest HR: 60 {beats}/min
Rest Nuclear Isotope Dose: 10.9 mCi
ST Depression (mm): 0 mm
Stress Nuclear Isotope Dose: 30.3 mCi
TID: 1.05

## 2021-09-06 MED ORDER — REGADENOSON 0.4 MG/5ML IV SOLN
INTRAVENOUS | Status: AC
  Administered 2021-09-06: 0.4 mg via INTRAVENOUS
  Filled 2021-09-06: qty 5

## 2021-09-06 MED ORDER — TECHNETIUM TC 99M TETROFOSMIN IV KIT
10.0000 | PACK | Freq: Once | INTRAVENOUS | Status: AC | PRN
  Administered 2021-09-06: 10.89 via INTRAVENOUS

## 2021-09-06 MED ORDER — SODIUM CHLORIDE FLUSH 0.9 % IV SOLN
INTRAVENOUS | Status: AC
  Administered 2021-09-06: 10 mL via INTRAVENOUS
  Filled 2021-09-06: qty 10

## 2021-09-06 MED ORDER — TECHNETIUM TC 99M TETROFOSMIN IV KIT
30.0000 | PACK | Freq: Once | INTRAVENOUS | Status: AC | PRN
  Administered 2021-09-06: 30.3 via INTRAVENOUS

## 2021-09-09 ENCOUNTER — Telehealth: Payer: Self-pay | Admitting: *Deleted

## 2021-09-09 NOTE — Telephone Encounter (Signed)
Patient informed. Copy sent to PCP & Dr. Carmelia Roller at [facility]

## 2021-09-09 NOTE — Telephone Encounter (Signed)
-----   Message from Merlene Laughter, RN sent at 09/09/2021 12:43 PM EDT -----  ----- Message ----- From: Verta Ellen., NP Sent: 09/08/2021   6:21 PM EDT To: Laurine Blazer, LPN  Please call the patient and let him know the stress test showed he has a low risk stress test per the interpreting physician.  The doctor commented could not totally exclude prior infarct with mild ischemia but in spite of that it was still considered a low risk test.  Verta Ellen, NP  09/08/2021 6:18 PM

## 2021-10-28 ENCOUNTER — Ambulatory Visit (INDEPENDENT_AMBULATORY_CARE_PROVIDER_SITE_OTHER): Payer: Medicare Other | Admitting: Gastroenterology

## 2021-11-18 ENCOUNTER — Encounter (INDEPENDENT_AMBULATORY_CARE_PROVIDER_SITE_OTHER): Payer: Self-pay | Admitting: Gastroenterology

## 2021-11-18 ENCOUNTER — Ambulatory Visit (INDEPENDENT_AMBULATORY_CARE_PROVIDER_SITE_OTHER): Payer: Medicare Other | Admitting: Gastroenterology

## 2021-11-18 ENCOUNTER — Other Ambulatory Visit: Payer: Self-pay

## 2021-11-18 VITALS — BP 115/71 | HR 112 | Temp 99.3°F | Ht 64.0 in | Wt 132.9 lb

## 2021-11-18 DIAGNOSIS — K8500 Idiopathic acute pancreatitis without necrosis or infection: Secondary | ICD-10-CM

## 2021-11-18 DIAGNOSIS — D649 Anemia, unspecified: Secondary | ICD-10-CM | POA: Diagnosis not present

## 2021-11-18 NOTE — Progress Notes (Signed)
Referring Provider: Glenda Chroman, MD Primary Care Physician:  Glenda Chroman, MD Primary GI Physician: newly established  Chief Complaint  Patient presents with   Hospitalization Follow-up    Patient arrives with wife Rodena Piety   HPI:   KIMBERLY COYE is a 71 y.o. male with past medical history of anemia, asthma, chronic pancreatitis, depression, reflux, HLD, insomnia, PAD, renal failure.   Patient presenting today as new patient for hospital follow up.  Patient had hospitalization in November for pancreatitis.  Patient reports that in 2002/2003 he was hospitalized for pancreatitis that kept flaring up, he states that he had not had any issues with this until November 2022, when he began having upper abdominal pain, especially on the Right side, CT at that time showed acute pancreatitis without necrosis or infection, with lipase 6956. He states that symptoms resolved and he has felt good as far as this goes since hospitalization. He was told that his pancreatitis was related to elevated calcium levels? Though per labs in EMR, he appears to have hypocalcemia.   Patient hospitalized again at [facility] 10/28/21 for a fall, he states that he fractured L shoulder, Left elbow and L femur.  Hgb at that time found to be 6.2, with iron 52, TIBC 191 and ferritin 176.6. repeat hgb on 12/26 8.4. he reports that he is currently on dialysis and has hx of iron deficiency anemia. He has had a colonoscopy/EGD in the past, maybe 5-6 years ago but is unsure who did these. He states that he was told his iron deficiency was related to a combination of poor nutritional intake and ESRD. He reports that he will have black, tarry stools, on occasion maybe 1-2x/week but has not noticed this for the past few weeks. He denies any BRBPR, he reports he does take iron pills, he feels that his black stools are related to iron pills/what he is eating. He has no abdominal pain, nausea or vomiting.   He reports that his  appetite is decent though Is at a rehab facility now so he does not really enjoy the foods there.   He denies any issues with acid reflux or heartburn, currently on protonix 40mg  daily. He has had some diarrhea occasionally he reports this has been off and on since his last abdominal surgery 8-10 years ago.   NSAID use: occasional ibuprofen Social hx: no etoh, tobacco use very infrequent since being in the rehab facility. Fam hx:no significant family history  Last Colonoscopy:5-6 years ago? Last Endoscopy:5-6 years ago?   Past Medical History:  Diagnosis Date   Acute bronchitis    Anemia    Arthritis    Asthma    Calcium blood increased    Chest pain    Chronic pancreatitis ([place])    Cough    Depression    [facility], was put on Trazodone   Dysrhythmia    fast heart rate, on Metoprolol, under control   Esophageal reflux    History of kidney stones    Hyperlipidemia    Hypertension    Insomnia    Nicotine dependence, cigarettes, uncomplicated    PAD (peripheral artery disease) (HCC)    Pneumonia    Polyp of colon    burst and lost a lot of blood   Pre-diabetes    lost weight, and has not had any symptoms since. 2020 A1C was 5.3   Pure hypercholesterolemia    Renal failure 10/2011   Stage 4  Past Surgical History:  Procedure Laterality Date   CARDIAC CATHETERIZATION     CHOLECYSTECTOMY     CIRCUMCISION  2008   COLONOSCOPY     to repair polyp   DEBRIDEMENT MANDIBLE Right 08/20/2020   Procedure: DEBRIDEMENT MANDIBLE;  Surgeon: [name], [name];  Location: [place];  Service: Oral Surgery;  Laterality: Right;   MANDIBULAR HARDWARE REMOVAL N/A 10/31/2020   Procedure: MANDIBULAR HARDWARE REMOVAL;  Surgeon: [name], [name];  Location: [place];  Service: Oral Surgery;  Laterality: N/A;   ORIF MANDIBULAR FRACTURE Right 08/20/2020   Procedure: OPEN REDUCTION INTERNAL FIXATION (ORIF) MANDIBULAR FRACTURE;  Surgeon: [name], [name];  Location: [place];   Service: Oral Surgery;  Laterality: Right;   ORIF MANDIBULAR FRACTURE N/A 10/31/2020   Procedure: OPEN REDUCTION INTERNAL FIXATION (ORIF) MANDIBULAR FRACTURE;  Surgeon: [name], [name];  Location: [place];  Service: Oral Surgery;  Laterality: N/A;   PANCREAS SURGERY      Current Outpatient Medications  Medication Sig Dispense Refill   acetaminophen (TYLENOL) 325 MG tablet Take 650 mg by mouth. 2 tablets every 6 hours prn     albuterol (VENTOLIN HFA) 108 (90 Base) MCG/ACT inhaler Inhale 2 puffs into the lungs 4 (four) times daily as needed for wheezing or shortness of breath.     amLODipine (NORVASC) 10 MG tablet Take 10 mg by mouth daily.     doxazosin (CARDURA) 2 MG tablet Take 2 mg by mouth daily.     isosorbide mononitrate (IMDUR) 30 MG 24 hr tablet Take 30 mg by mouth 2 (two) times daily.      metoprolol tartrate (LOPRESSOR) 50 MG tablet Take 25-50 mg by mouth See admin instructions. Take 50mg  in the morning & 25mg  in the evening     nitroGLYCERIN (NITROSTAT) 0.4 MG SL tablet Place 1 tablet under the tongue every 5 (five) minutes x 3 doses as needed.     pantoprazole (PROTONIX) 40 MG tablet Take 40 mg by mouth daily.     senna-docusate (SENOKOT-S) 8.6-50 MG tablet Take 1 tablet by mouth daily.     simvastatin (ZOCOR) 40 MG tablet Take 40 mg by mouth daily.     traZODone (DESYREL) 50 MG tablet Take 50 mg by mouth at bedtime.     triamcinolone cream (KENALOG) 0.1 % Apply 1 application topically 2 (two) times daily as needed (rash).     No current facility-administered medications for this visit.    Allergies as of 11/18/2021 - Review Complete 11/18/2021  Allergen Reaction Noted   Codeine Hives 07/16/2018   Neosporin [bacitracin-polymyxin b] Itching 08/20/2020    Family History  Problem Relation Age of Onset   Heart attack Mother    Alzheimer's disease Father    Heart disease Sister    Hypertension Sister    COPD Sister    COPD Brother    Hypertension Sister    Arthritis  Sister    Hypertension Sister    Heart failure Brother    COPD Brother    Heart failure Brother    Heart failure Brother     Social History   Socioeconomic History   Marital status: Married    Spouse name: Not on file   Number of children: Not on file   Years of education: Not on file   Highest education level: Not on file  Occupational History   Not on file  Tobacco Use   Smoking status: Every Day    Packs/day: 1.00  Years: 16.00    Pack years: 16.00    Types: Cigarettes    Start date: 08/15/1965   Smokeless tobacco: Never  Vaping Use   Vaping Use: Never used  Substance and Sexual Activity   Alcohol use: Yes    Comment: rare   Drug use: Never   Sexual activity: Not on file  Other Topics Concern   Not on file  Social History Narrative   Not on file   Social Determinants of Health   Financial Resource Strain: Not on file  Food Insecurity: Not on file  Transportation Needs: Not on file  Physical Activity: Not on file  Stress: Not on file  Social Connections: Not on file   Review of systems General: negative for malaise, night sweats, fever, chills, weight loss Neck: Negative for lumps, goiter, pain and significant neck swelling Resp: Negative for cough, wheezing, dyspnea at rest CV: Negative for chest pain, leg swelling, palpitations, orthopnea GI: denies hematochezia, nausea, vomiting, constipation, dysphagia, odyonophagia, early satiety or unintentional weight loss. +diarrhea +black tarry stools MSK: Negative for joint pain or swelling, back pain, and muscle pain. Derm: Negative for itching or rash Psych: Denies depression, anxiety, memory loss, confusion. No homicidal or suicidal ideation.  Heme: Negative for prolonged bleeding, bruising easily, and swollen nodes. Endocrine: Negative for cold or heat intolerance, polyuria, polydipsia and goiter. Neuro: negative for tremor, gait imbalance, syncope and seizures. The remainder of the review of systems is  noncontributory.  Physical Exam: BP 115/71 (BP Location: Right Arm, Patient Position: Sitting, Cuff Size: Large)    Pulse (!) 112    Temp 99.3 F (37.4 C) (Oral)    Ht 5\' 4"  (1.626 m)    Wt 132 lb 14.4 oz (60.3 kg)    BMI 22.81 kg/m  General:   Alert and oriented. No distress noted. Pleasant and cooperative.  Head:  Normocephalic and atraumatic. Eyes:  Conjuctiva clear without scleral icterus. Mouth:  Oral mucosa pink and moist. Good dentition. No lesions. Heart: Normal rate and rhythm, s1 and s2 heart sounds present.  Lungs: Clear lung sounds in all lobes. Respirations equal and unlabored. Abdomen:  +BS, soft, non-tender and non-distended. No rebound or guarding. No HSM or masses noted. Derm: No palmar erythema or jaundice Msk:  Symmetrical without gross deformities. Normal posture. Extremities:  Without edema. Neurologic:  Alert and  oriented x4 Psych:  Alert and cooperative. Normal mood and affect.  Invalid input(s): 6 MONTHS   ASSESSMENT: STEPHONE GUM is a 71 y.o. male presenting today for hospital follow up of recurrent acute pancreatitis and anemia.  Recurrent idiopathic pancreatitis with etiology unknown at this time. Previous cholecystectomy with LFTs WNL and hypocalcemia. Patient does not drink alcohol. He feels good today, denies any abdominal pain, states he has never had nausea or vomiting even with his pancreatitis. We will plan for MRCP w/ pancreatic protocol in about 2 months for further evaluation/rule out any malignant cause of recurrence, though given the chronicity of his pancreatitis, malignancy is unlikely.  Given his hgb was down to 6.2 in November, and still remains low at 8.4, I discussed indications for EGD and colonoscopy with the patient as this is my recommendation for further evaluation of his significant anemia. Patient is very addiment that he feels his anemia was related to his pancreatitis/diet, though I explained etiology of anemia is not usually  related to acute pancreatitis, but usually a sign of blood loss in the GI tract, though I suspect some aspect of  his anemia is related to his ESRD, baseline hgb appears to be around 10-12.  Patient refused EGD and colonscopy at this time. He will let me know if he develops any recurrent black tarry stools, rectal bleeding, sob, fatigue or syncope or if he wishes to proceed with EGD and colonoscopy. Unfortunately I cannot review records of his previous egd and colonoscopy in EMR and he is unsure who performed these.  PLAN:  CBC today 2. Patient to let me know if he would like to proceed with egd/colonoscopy 3. MRCP with pancreatic protocol in 2 months   Follow Up: 3 months  Raeonna Milo L. Alver Sorrow, MSN, APRN, AGNP-C Adult-Gerontology Nurse Practitioner [facility]

## 2021-11-18 NOTE — Patient Instructions (Addendum)
We will check your blood counts today to see how your hemoglobin is doing, please let me know if you have any further black stools or rectal bleeding, fatigue, shortness of breath or episodes where you pass out. As we discussed, my recommendation is to proceed with EGD/colonoscopy for further evaluation of the significant anemia you had, if you change your mind and wish to proceed, please let me know.  As far as the pancreatitis, we will plan to do an MRI of your pancreas in about 2 months for further evaluation of this.  Follow up 3 months

## 2021-12-12 ENCOUNTER — Other Ambulatory Visit (INDEPENDENT_AMBULATORY_CARE_PROVIDER_SITE_OTHER): Payer: Self-pay

## 2021-12-12 DIAGNOSIS — K8500 Idiopathic acute pancreatitis without necrosis or infection: Secondary | ICD-10-CM

## 2021-12-25 ENCOUNTER — Ambulatory Visit (HOSPITAL_COMMUNITY)
Admission: RE | Admit: 2021-12-25 | Discharge: 2021-12-25 | Disposition: A | Discharge status: Home / Self Care | Payer: Medicare Other | Source: Ambulatory Visit | Attending: Gastroenterology | Admitting: Gastroenterology

## 2021-12-25 ENCOUNTER — Other Ambulatory Visit (INDEPENDENT_AMBULATORY_CARE_PROVIDER_SITE_OTHER): Payer: Self-pay | Admitting: Gastroenterology

## 2021-12-25 ENCOUNTER — Other Ambulatory Visit: Payer: Self-pay

## 2021-12-25 DIAGNOSIS — K8500 Idiopathic acute pancreatitis without necrosis or infection: Secondary | ICD-10-CM

## 2021-12-25 LAB — POCT I-STAT CREATININE: Creatinine, Ser: 5.1 mg/dL — ABNORMAL HIGH (ref 0.61–1.24)

## 2021-12-25 MED ORDER — GADOBUTROL 1 MMOL/ML IV SOLN
7.0000 mL | Freq: Once | INTRAVENOUS | Status: AC | PRN
  Administered 2021-12-25: 7 mL via INTRAVENOUS

## 2022-02-20 ENCOUNTER — Ambulatory Visit (INDEPENDENT_AMBULATORY_CARE_PROVIDER_SITE_OTHER): Payer: Medicare Other | Admitting: Gastroenterology

## 2022-05-03 IMAGING — MR MR MRCP
11 of 12 series · 37 of 48 positions shown · non-contrast
Comparison: CT abdomen 10/05/2021

CLINICAL DATA: Pancreatitis

EXAM:
MRI ABDOMEN WITHOUT CONTRAST  (INCLUDING MRCP)
TECHNIQUE: Multiplanar multisequence MR imaging of the abdomen was performed.
Heavily T2-weighted images of the biliary and pancreatic ducts were
obtained, and three-dimensional MRCP images were rendered by post
processing.

[Series 3: cor haste · coronal · 6.0mm · 1.25mm/px · 3 of 30 slices shown]
[im 1/30]
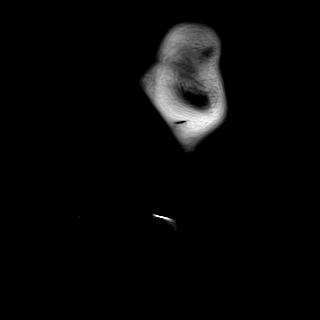
[im 15/30]
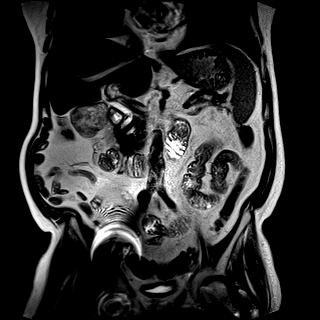
[im 30/30]
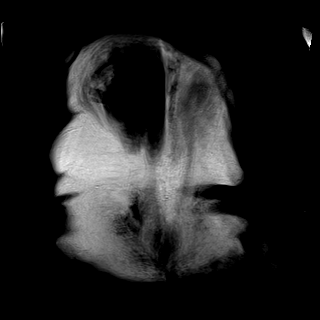

[Series 4: ax haste · axial · 6.0mm · 1.19mm/px · z∈[-217,+20]mm · 3 of 34 slices shown]
[im 1/34]
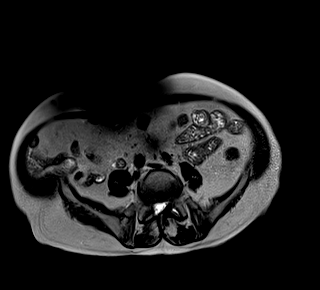
[im 17/34]
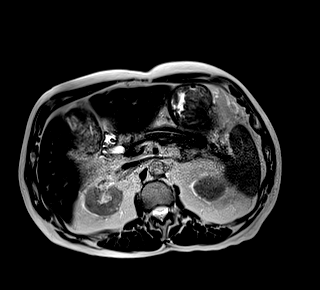
[im 34/34]
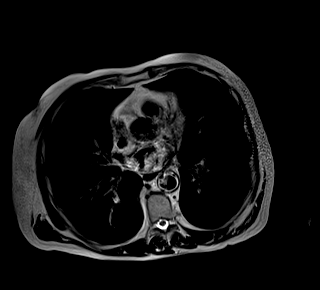

[Series 5: T2 fat-sat · axial · 6.0mm · 1.19mm/px · z∈[-208,+30]mm · 3 of 34 slices shown]
[im 1/34]
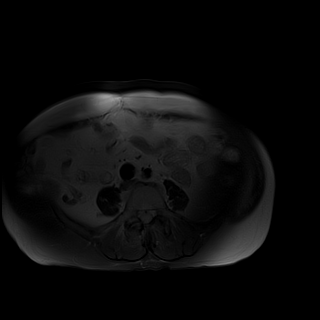
[im 17/34]
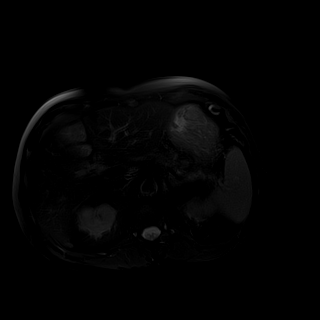
[im 34/34]
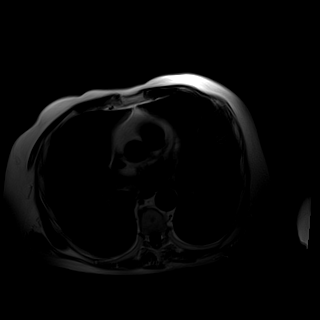

[Series 11: DWI · axial · 6.0mm · 1.42mm/px · z∈[-203,+6]mm · 2 of 30 slices shown (1 of 4)]
[im 1/30]
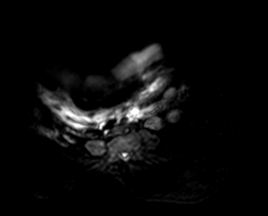
[im 30/30]
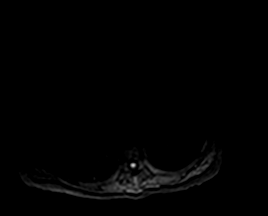

[Series 11: DWI · axial · 6.0mm · 1.42mm/px · z∈[-203,+6]mm · 2 of 30 slices shown (2 of 4)]
[im 1/30]
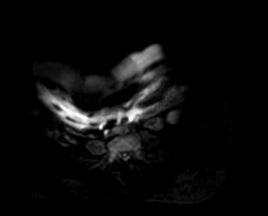
[im 30/30]
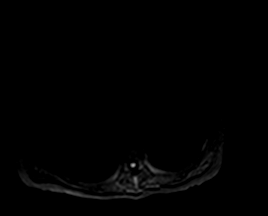

[Series 11: DWI · axial · 6.0mm · 1.42mm/px · z∈[-203,+6]mm · 2 of 30 slices shown (3 of 4)]
[im 1/30]
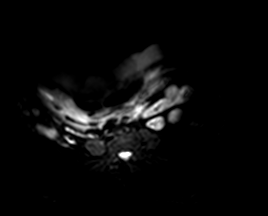
[im 30/30]
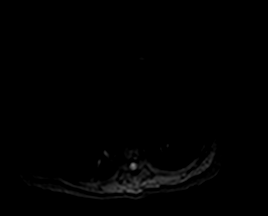

[Series 12: DWI · axial · 6.0mm · 1.42mm/px · z∈[-203,+6]mm · 2 of 30 slices shown (4 of 4)]
[im 1/30]
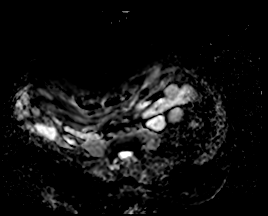
[im 30/30]
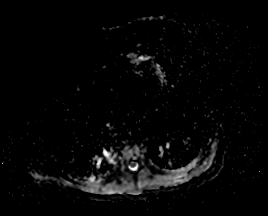

[Series 13: ax in and · axial · 3.0mm · 1.19mm/px · z∈[-220,-7]mm · 5 of 69 slices shown (1 of 2)]
[im 1/69]
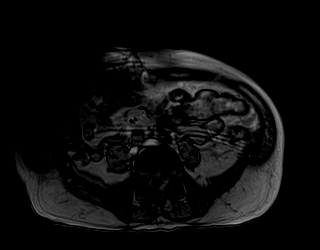
[im 18/69]
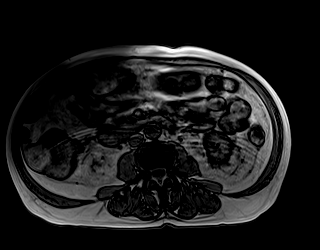
[im 35/69]
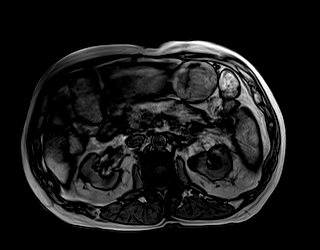
[im 52/69]
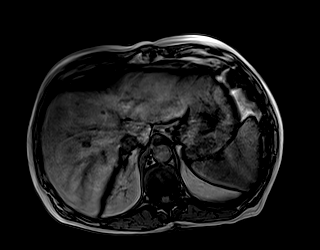
[im 69/69]
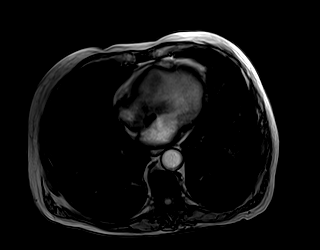

[Series 14: ax in and · axial · 3.0mm · 1.19mm/px · z∈[-220,-7]mm · 5 of 72 slices shown (2 of 2)]
[im 1/72]
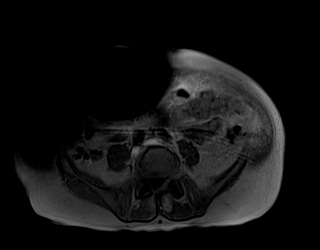
[im 18/72]
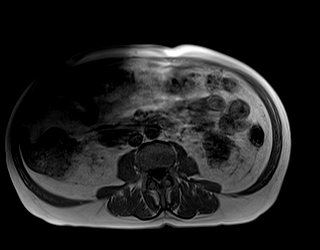
[im 36/72]
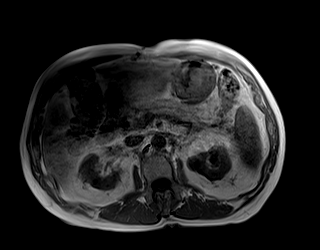
[im 54/72]
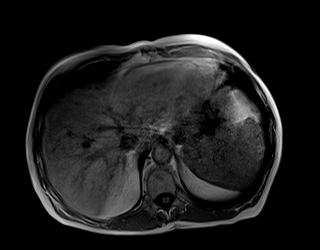
[im 72/72]
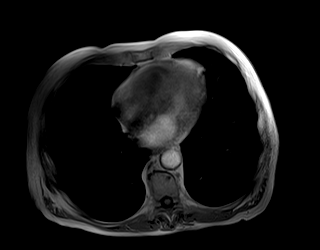

[Series 15: T1 dynamic · axial · non-contrast · 3.0mm · 1.19mm/px · z∈[-222,-9]mm · 5 of 72 slices shown (1 of 2)]
[im 1/72]
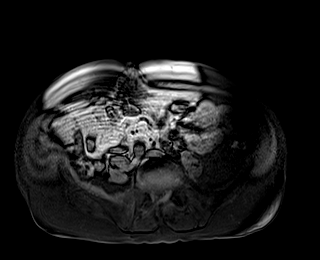
[im 18/72]
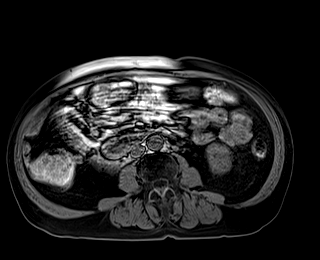
[im 36/72]
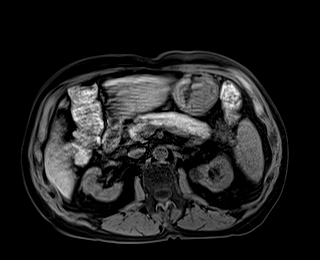
[im 54/72]
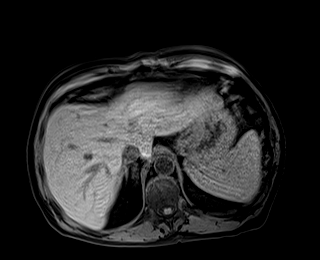
[im 72/72]
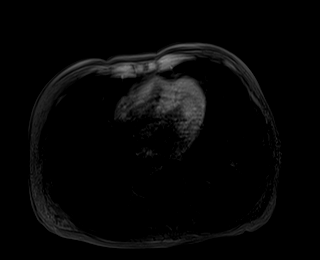

[Series 16: T1 dynamic · coronal · 3.0mm · 1.31mm/px · 5 of 72 slices shown (2 of 2)]
[im 1/72]
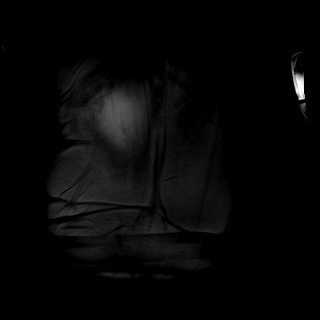
[im 18/72]
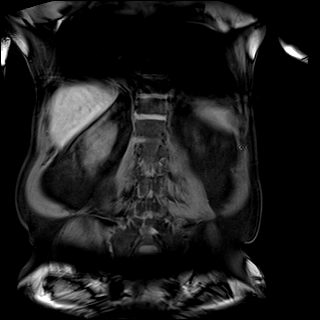
[im 36/72]
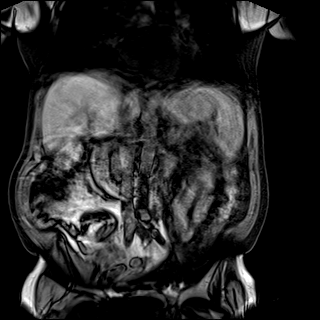
[im 54/72]
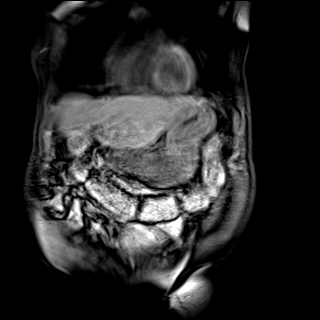
[im 72/72]
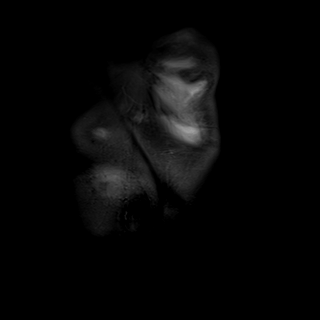

[37 of 48 positions shown; findings below may reference images not displayed]

FINDINGS: Study is limited due to motion.

Lower chest: No acute findings.

Hepatobiliary: Liver is normal in size in contour. No evidence of
hepatic steatosis. No suspicious hepatic mass identified.
Gallbladder is surgically absent. No biliary ductal dilatation or
choledocholithiasis visualized.

Pancreas: No pancreatic or peripancreatic edema identified. No
pancreatic mass or ductal dilatation. Appears to be a focal
pancreatic ductal stricture on MRCP in the body of the pancreas,
versus artifact.

Spleen:  Within normal limits in size and appearance.

Adrenals/Urinary Tract: Adrenal glands appear grossly normal.
Several renal cortical cysts identified bilaterally measuring up to
2.6 cm on the left. No hydronephrosis visualized.

Stomach/Bowel: No evidence of bowel obstruction. Colonic
diverticulosis.

Vascular/Lymphatic: No pathologically enlarged lymph nodes
identified. No abdominal aortic aneurysm demonstrated.

Other:  No ascites.

Musculoskeletal: No suspicious bone lesions identified.
IMPRESSION: 1. No pancreatic inflammatory changes, mass or ductal dilatation.
Note is made of possible focal pancreatic ductal stricture versus
artifact in the body of the pancreas.
2. Colonic diverticulosis.
3. Renal cortical cysts.

## 2022-11-20 ENCOUNTER — Other Ambulatory Visit (HOSPITAL_COMMUNITY): Payer: Self-pay | Admitting: Nephrology

## 2022-11-20 ENCOUNTER — Other Ambulatory Visit (HOSPITAL_COMMUNITY)
Admission: RE | Admit: 2022-11-20 | Discharge: 2022-11-20 | Disposition: A | Discharge status: Home / Self Care | Payer: Medicare Other | Source: Ambulatory Visit | Attending: Nephrology | Admitting: Nephrology

## 2022-11-20 ENCOUNTER — Telehealth (HOSPITAL_COMMUNITY): Payer: Self-pay

## 2022-11-20 DIAGNOSIS — D631 Anemia in chronic kidney disease: Secondary | ICD-10-CM | POA: Diagnosis not present

## 2022-11-20 DIAGNOSIS — N170 Acute kidney failure with tubular necrosis: Secondary | ICD-10-CM | POA: Insufficient documentation

## 2022-11-20 DIAGNOSIS — N185 Chronic kidney disease, stage 5: Secondary | ICD-10-CM | POA: Diagnosis not present

## 2022-11-20 DIAGNOSIS — I129 Hypertensive chronic kidney disease with stage 1 through stage 4 chronic kidney disease, or unspecified chronic kidney disease: Secondary | ICD-10-CM | POA: Diagnosis not present

## 2022-11-20 DIAGNOSIS — E1122 Type 2 diabetes mellitus with diabetic chronic kidney disease: Secondary | ICD-10-CM | POA: Diagnosis not present

## 2022-11-20 DIAGNOSIS — R600 Localized edema: Secondary | ICD-10-CM | POA: Diagnosis present

## 2022-11-20 DIAGNOSIS — R808 Other proteinuria: Secondary | ICD-10-CM | POA: Diagnosis not present

## 2022-11-20 DIAGNOSIS — N281 Cyst of kidney, acquired: Secondary | ICD-10-CM | POA: Insufficient documentation

## 2022-11-20 DIAGNOSIS — E211 Secondary hyperparathyroidism, not elsewhere classified: Secondary | ICD-10-CM | POA: Diagnosis not present

## 2022-11-20 DIAGNOSIS — E8722 Chronic metabolic acidosis: Secondary | ICD-10-CM | POA: Diagnosis not present

## 2022-11-20 DIAGNOSIS — E559 Vitamin D deficiency, unspecified: Secondary | ICD-10-CM | POA: Insufficient documentation

## 2022-11-20 LAB — RENAL FUNCTION PANEL
Albumin: 2.4 g/dL — ABNORMAL LOW (ref 3.5–5.0)
Anion gap: 12 (ref 5–15)
BUN: 64 mg/dL — ABNORMAL HIGH (ref 8–23)
CO2: 21 mmol/L — ABNORMAL LOW (ref 22–32)
Calcium: 8.4 mg/dL — ABNORMAL LOW (ref 8.9–10.3)
Chloride: 103 mmol/L (ref 98–111)
Creatinine, Ser: 6.23 mg/dL — ABNORMAL HIGH (ref 0.61–1.24)
GFR, Estimated: 9 mL/min — ABNORMAL LOW (ref >60)
Glucose, Bld: 97 mg/dL (ref 70–99)
Phosphorus: 6.2 mg/dL — ABNORMAL HIGH (ref 2.5–4.6)
Potassium: 4.9 mmol/L (ref 3.5–5.1)
Sodium: 136 mmol/L (ref 135–145)

## 2022-11-20 LAB — IRON AND TIBC
Iron: 84 ug/dL (ref 45–182)
Saturation Ratios: 27 % (ref 17.9–39.5)
TIBC: 316 ug/dL (ref 250–450)
UIBC: 232 ug/dL

## 2022-11-20 LAB — CBC
HCT: 37 % — ABNORMAL LOW (ref 39.0–52.0)
Hemoglobin: 11.2 g/dL — ABNORMAL LOW (ref 13.0–17.0)
MCH: 32.2 pg (ref 26.0–34.0)
MCHC: 30.3 g/dL (ref 30.0–36.0)
MCV: 106.3 fL — ABNORMAL HIGH (ref 80.0–100.0)
Platelets: 142 10*3/uL — ABNORMAL LOW (ref 150–400)
RBC: 3.48 MIL/uL — ABNORMAL LOW (ref 4.22–5.81)
RDW: 17.5 % — ABNORMAL HIGH (ref 11.5–15.5)
WBC: 6.9 10*3/uL (ref 4.0–10.5)
nRBC: 0 % (ref 0.0–0.2)

## 2022-11-20 LAB — HEPATITIS B SURFACE ANTIGEN: Hepatitis B Surface Ag: NONREACTIVE

## 2022-11-20 LAB — PROTEIN / CREATININE RATIO, URINE
Creatinine, Urine: 98.18 mg/dL
Protein Creatinine Ratio: 10.93 mg/mg{Cre} — ABNORMAL HIGH (ref 0.00–0.15)
Total Protein, Urine: 1073 mg/dL

## 2022-11-20 LAB — VITAMIN D 25 HYDROXY (VIT D DEFICIENCY, FRACTURES): Vit D, 25-Hydroxy: 10.58 ng/mL — ABNORMAL LOW (ref 30–100)

## 2022-11-20 LAB — HEPATITIS C ANTIBODY: HCV Ab: NONREACTIVE

## 2022-11-20 LAB — FERRITIN: Ferritin: 144 ng/mL (ref 24–336)

## 2022-11-20 LAB — HEPATITIS B CORE ANTIBODY, TOTAL: Hep B Core Total Ab: NONREACTIVE

## 2022-11-20 NOTE — Telephone Encounter (Signed)
Called to schedule catheter exchange, no answer, left vm. AW

## 2022-11-21 LAB — PARATHYROID HORMONE, INTACT (NO CA): PTH: 149 pg/mL — ABNORMAL HIGH (ref 15–65)

## 2022-11-21 LAB — HEPATITIS B SURFACE ANTIBODY, QUANTITATIVE: Hep B S AB Quant (Post): <3.1 m[IU]/mL — ABNORMAL LOW (ref >9.9)

## 2022-11-21 LAB — HCV RNA QUANT: HCV Quantitative: NOT DETECTED IU/mL (ref >50)

## 2022-11-24 LAB — QUANTIFERON-TB GOLD PLUS: QuantiFERON-TB Gold Plus: NEGATIVE

## 2022-11-24 LAB — QUANTIFERON-TB GOLD PLUS (RQFGPL)
QuantiFERON Mitogen Value: 2.12 IU/mL
QuantiFERON Nil Value: 0 IU/mL
QuantiFERON TB1 Ag Value: 0 IU/mL
QuantiFERON TB2 Ag Value: 0 IU/mL

## 2022-11-26 ENCOUNTER — Other Ambulatory Visit: Payer: Self-pay | Admitting: Radiology

## 2022-11-27 ENCOUNTER — Other Ambulatory Visit (HOSPITAL_COMMUNITY): Payer: Self-pay | Admitting: Nephrology

## 2022-11-27 ENCOUNTER — Ambulatory Visit (HOSPITAL_COMMUNITY)
Admission: RE | Admit: 2022-11-27 | Discharge: 2022-11-27 | Disposition: A | Discharge status: Home / Self Care | Payer: Medicare Other | Source: Ambulatory Visit | Attending: Nephrology | Admitting: Nephrology

## 2022-11-27 DIAGNOSIS — Z992 Dependence on renal dialysis: Secondary | ICD-10-CM | POA: Insufficient documentation

## 2022-11-27 DIAGNOSIS — I120 Hypertensive chronic kidney disease with stage 5 chronic kidney disease or end stage renal disease: Secondary | ICD-10-CM | POA: Diagnosis not present

## 2022-11-27 DIAGNOSIS — N170 Acute kidney failure with tubular necrosis: Secondary | ICD-10-CM | POA: Diagnosis present

## 2022-11-27 DIAGNOSIS — N186 End stage renal disease: Secondary | ICD-10-CM | POA: Diagnosis not present

## 2022-11-27 HISTORY — PX: IR FLUORO GUIDE CV LINE RIGHT: IMG2283

## 2022-11-27 MED ORDER — LIDOCAINE HCL 1 % IJ SOLN
INTRAMUSCULAR | Status: AC
  Administered 2022-11-27: 10 mL
  Filled 2022-11-27: qty 20

## 2022-11-27 MED ORDER — CHLORHEXIDINE GLUCONATE 4 % EX LIQD
CUTANEOUS | Status: AC
  Filled 2022-11-27: qty 15

## 2022-11-27 MED ORDER — CEFAZOLIN SODIUM-DEXTROSE 2-4 GM/100ML-% IV SOLN
INTRAVENOUS | Status: AC
  Administered 2022-11-27: 2 g
  Filled 2022-11-27: qty 100

## 2022-11-27 MED ORDER — HEPARIN SODIUM (PORCINE) 1000 UNIT/ML IJ SOLN
INTRAMUSCULAR | Status: AC
  Filled 2022-11-27: qty 10

## 2022-11-27 NOTE — Procedures (Signed)
Interventional Radiology Procedure Note  Procedure:   Routine exchange of a non-functional right IJ tunneled HD.  The blue/red lines of the old catheter are thrombosed, no aspiration.  New catheter is 19cm tip to cuff. Aspirates vigorously.  No sign of fibrin sheath.    Complications: None Recommendations:  - Ok to use - Do not submerge - Routine line care   Signed,  Dulcy Fanny. Earleen Newport, DO

## 2022-11-27 NOTE — H&P (Signed)
   Patient Status: [facility] - Out-pt  Assessment and Plan: Patient in need of venous access.   Patient with a history of ESRD on hemodialysis via a right IJ tunneled dialysis catheter placed 11/01/21 at [facility] in [place]. The patient has not had hemodialysis in approximately one year and the catheter is malfunctioning. Interventional Radiology has been asked to evaluate this patient for removal of his current line and placement of a new tunneled dialysis catheter. This procedure will be done with  local anesthesia only.    Risks and benefits discussed with the patient including, but not limited to bleeding, infection, vascular injury, pneumothorax which may require chest tube placement, air embolism or even death  All of the patient's questions were answered, patient is agreeable to proceed.   Consent signed and in chart. ______________________________________________________________________   History of Present Illness: Chad Mccall is a 72 y.o. male with a medical history significant for HTN and ESRD on hemodialysis. He states his last hemodialysis was one year ago and he stopped HD due to some recovery of renal function. His renal function has declined and he now needs to restart HD. He currently has a right IJ tunneled dialysis catheter placed at an outside facility. Due to non-use it is currently malfunctioning and needs to be exchanged.    Allergies and medications reviewed.   Review of Systems: A 12 point ROS discussed and pertinent positives are indicated in the HPI above.  All other systems are negative.  Review of Systems  Cardiovascular:  Positive for leg swelling. Negative for chest pain.  Gastrointestinal:  Negative for abdominal pain, diarrhea, nausea and vomiting.    Physical Exam Constitutional:      General: He is not in acute distress.    Appearance: He is not ill-appearing.  HENT:     Mouth/Throat:     Mouth: Mucous membranes are moist.     Pharynx:  Oropharynx is clear.  Pulmonary:     Effort: Pulmonary effort is normal.  Musculoskeletal:     Right lower leg: Edema present.     Left lower leg: Edema present.  Skin:    General: Skin is warm and dry.  Neurological:     Mental Status: He is alert and oriented to person, place, and time.  Psychiatric:        Mood and Affect: Mood normal.        Behavior: Behavior normal.        Thought Content: Thought content normal.      Imaging reviewed.   Labs:  COAGS: No results for input(s): "INR", "APTT" in the last 8760 hours.  BMP: Recent Labs    12/25/21 1007 11/20/22 1247  NA  --  136  K  --  4.9  CL  --  103  CO2  --  21*  GLUCOSE  --  97  BUN  --  64*  CALCIUM  --  8.4*  CREATININE 5.10* 6.23*  GFRNONAA  --  9*       Electronically Signed: Theresa Duty, NP 11/27/2022, 8:48 AM   I spent a total of 15 minutes in face to face in clinical consultation, greater than 50% of which was counseling/coordinating care for venous access.

## 2022-12-10 ENCOUNTER — Encounter: Payer: Self-pay | Admitting: Vascular Surgery

## 2022-12-10 ENCOUNTER — Ambulatory Visit (INDEPENDENT_AMBULATORY_CARE_PROVIDER_SITE_OTHER): Payer: Medicare Other | Admitting: Vascular Surgery

## 2022-12-10 VITALS — BP 159/90 | HR 69 | Temp 97.7°F | Ht 64.0 in | Wt 111.8 lb

## 2022-12-10 DIAGNOSIS — Z992 Dependence on renal dialysis: Secondary | ICD-10-CM

## 2022-12-10 DIAGNOSIS — N186 End stage renal disease: Secondary | ICD-10-CM | POA: Diagnosis not present

## 2022-12-10 NOTE — Progress Notes (Signed)
[facility] [place]  Patient name: Chad Mccall MRN: [id number] DOB: Dec 04, 1950 Sex: male  REASON FOR CONSULT: Evaluate options for hemodialysis  HPI: Chad Mccall is a 72 y.o. male, who is here today for discussion of options for hemodialysis.  He is here today with his wife.  He did have acute need for dialysis approximately 1 year ago and had a tunneled catheter placed at that time.  He has not been on dialysis for nearly a year.  He recently has had progression again and is reinstituted hemodialysis.  He had his old catheter exchanged for a new tunneled catheter through interventional radiology on 11/27/2022.  He is here today for discussion of arm access.  He is right-handed.  He does not have a pacemaker.  He is not on anticoagulant.  Past Medical History:  Diagnosis Date   Acute bronchitis    Anemia    Arthritis    Asthma    Calcium blood increased    Chest pain    Chronic pancreatitis ([place])    Cough    Depression    [facility], was put on Trazodone   Dysrhythmia    fast heart rate, on Metoprolol, under control   Esophageal reflux    History of kidney stones    Hyperlipidemia    Hypertension    Insomnia    Nicotine dependence, cigarettes, uncomplicated    PAD (peripheral artery disease) (HCC)    Pneumonia    Polyp of colon    burst and lost a lot of blood   Pre-diabetes    lost weight, and has not had any symptoms since. 2020 A1C was 5.3   Pure hypercholesterolemia    Renal failure 10/2011   Stage 4    Family History  Problem Relation Age of Onset   Heart attack Mother    Alzheimer's disease Father    Heart disease Sister    Hypertension Sister    COPD Sister    COPD Brother    Hypertension Sister    Arthritis Sister    Hypertension Sister    Heart failure Brother    COPD Brother    Heart failure Brother    Heart failure Brother     SOCIAL HISTORY: Social History    Socioeconomic History   Marital status: Married    Spouse name: Not on file   Number of children: Not on file   Years of education: Not on file   Highest education level: Not on file  Occupational History   Not on file  Tobacco Use   Smoking status: Every Day    Packs/day: 1.00    Years: 16.00    Total pack years: 16.00    Types: Cigarettes    Start date: 08/15/1965   Smokeless tobacco: Never  Vaping Use   Vaping Use: Never used  Substance and Sexual Activity   Alcohol use: Yes    Comment: rare   Drug use: Never   Sexual activity: Not on file  Other Topics Concern   Not on file  Social History Narrative   Not on file   Social Determinants of Health   Financial Resource Strain: Not on file  Food Insecurity: Not on file  Transportation Needs: Not on file  Physical Activity: Not on file  Stress: Not on file  Social Connections: Not on file  Intimate Partner Violence: Not on file    Allergies  Allergen Reactions   Codeine  Hives    Palpitations    Fentanyl Other (See Comments)    Hypotension   Meperidine Hcl     Other Reaction(s): Hallucinations   Neosporin [Bacitracin-Polymyxin B] Itching    Current Outpatient Medications  Medication Sig Dispense Refill   acetaminophen (TYLENOL) 325 MG tablet Take 650 mg by mouth. 2 tablets every 6 hours prn     albuterol (VENTOLIN HFA) 108 (90 Base) MCG/ACT inhaler Inhale 2 puffs into the lungs 4 (four) times daily as needed for wheezing or shortness of breath.     ALPRAZolam (XANAX) 1 MG tablet Take 1 mg by mouth.     cyclobenzaprine (FLEXERIL) 10 MG tablet 10 mg.     dicyclomine (BENTYL) 10 MG capsule Take 1 capsule by mouth 2 (two) times daily.     dutasteride (AVODART) 0.5 MG capsule Take 1 tablet by mouth daily.     ergocalciferol (VITAMIN D2) 1.25 MG (50000 UT) capsule Take 50,000 Units by mouth.     fenofibrate 160 MG tablet Take 160 mg by mouth daily.     FEROSUL 325 (65 Fe) MG tablet Take 325 mg by mouth daily.      fosinopril (MONOPRIL) 10 MG tablet Take 10 mg by mouth.     gabapentin (NEURONTIN) 300 MG capsule Take 300 mg by mouth 2 (two) times daily.     isosorbide mononitrate (IMDUR) 30 MG 24 hr tablet Take 30 mg by mouth 2 (two) times daily.      metoprolol tartrate (LOPRESSOR) 50 MG tablet Take 25-50 mg by mouth See admin instructions. Take 50mg  in the morning & 25mg  in the evening     nitroGLYCERIN (NITROSTAT) 0.4 MG SL tablet Place 1 tablet under the tongue every 5 (five) minutes x 3 doses as needed.     pantoprazole (PROTONIX) 40 MG tablet Take 40 mg by mouth daily.     senna-docusate (SENOKOT-S) 8.6-50 MG tablet Take 1 tablet by mouth daily.     simvastatin (ZOCOR) 40 MG tablet Take 40 mg by mouth daily.     traZODone (DESYREL) 50 MG tablet Take 50 mg by mouth at bedtime.     triamcinolone cream (KENALOG) 0.1 % Apply 1 application topically 2 (two) times daily as needed (rash).     amLODipine (NORVASC) 10 MG tablet Take 10 mg by mouth daily. (Patient not taking: Reported on 12/10/2022)     doxazosin (CARDURA) 2 MG tablet Take 2 mg by mouth daily. (Patient not taking: Reported on 12/10/2022)     No current facility-administered medications for this visit.    REVIEW OF SYSTEMS:  [X]  denotes positive finding, [ ]  denotes negative finding Cardiac  Comments:  Chest pain or chest pressure:    Shortness of breath upon exertion:    Short of breath when lying flat:    Irregular heart rhythm:        Vascular    Pain in calf, thigh, or hip brought on by ambulation:    Pain in feet at night that wakes you up from your sleep:     Blood clot in your veins:    Leg swelling:  x       Pulmonary    Oxygen at home:    Productive cough:  x   Wheezing:         Neurologic    Sudden weakness in arms or legs:     Sudden numbness in arms or legs:     Sudden onset of difficulty speaking or  slurred speech:    Temporary loss of vision in one eye:     Problems with dizziness:         Gastrointestinal     Blood in stool:     Vomited blood:         Genitourinary    Burning when urinating:     Blood in urine:        Psychiatric    Major depression:         Hematologic    Bleeding problems:    Problems with blood clotting too easily:        Skin    Rashes or ulcers:        Constitutional    Fever or chills:      PHYSICAL EXAM: Vitals:   12/10/22 0940  BP: (!) 159/90  Pulse: 69  Temp: 97.7 F (36.5 C)  SpO2: 95%  Weight: 111 lb 12.8 oz (50.7 kg)  Height: 5\' 4"  (1.626 m)    GENERAL: The patient is a well-nourished male, in no acute distress. The vital signs are documented above. CARDIOVASCULAR: 2+ radial pulses bilaterally.  Extremely small surface veins by physical exam bilaterally PULMONARY: There is good air exchange  MUSCULOSKELETAL: There are no major deformities or cyanosis. NEUROLOGIC: No focal weakness or paresthesias are detected. SKIN: There are no ulcers or rashes noted. PSYCHIATRIC: The patient has a normal affect.  DATA:  I did image his veins with SonoSite ultrasound.  This does show small cephalic and basilic veins bilaterally.  MEDICAL ISSUES:  Discussion with patient regarding options for hemodialysis.  I discussed the limitations of long-term use of catheter with infectious risk.  I discussed AV fistula and AV graft placements and the advantages and disadvantages of each of these.  He does not appear to be a candidate for fistula due to vein size.  I explained that we would reassess this at time of surgery but in all likelihood will require a Gore-Tex graft.  We will plan for this on 12/23/2022 at [facility]  He currently undergoes hemodialysis in [place] on Tuesday Thursday Saturday.  We will coordinate bring his dialysis today with the [place] [facility] [phone] Pager [phone]  Note: Portions of this report may have been transcribed using voice  recognition software.  Every effort has been made to ensure accuracy; however, inadvertent computerized transcription errors may still be present.

## 2022-12-12 ENCOUNTER — Other Ambulatory Visit: Payer: Self-pay

## 2022-12-12 DIAGNOSIS — N186 End stage renal disease: Secondary | ICD-10-CM

## 2022-12-19 ENCOUNTER — Encounter (HOSPITAL_COMMUNITY)
Admission: RE | Admit: 2022-12-19 | Discharge: 2022-12-19 | Disposition: A | Discharge status: Home / Self Care | Payer: Medicare Other | Source: Ambulatory Visit | Attending: Vascular Surgery | Admitting: Vascular Surgery

## 2022-12-19 DIAGNOSIS — N186 End stage renal disease: Secondary | ICD-10-CM

## 2022-12-23 ENCOUNTER — Ambulatory Visit (HOSPITAL_COMMUNITY): Admission: RE | Admit: 2022-12-23 | Payer: Medicare Other | Source: Home / Self Care | Admitting: Vascular Surgery

## 2022-12-23 ENCOUNTER — Encounter (HOSPITAL_COMMUNITY): Payer: Self-pay | Admitting: Certified Registered"

## 2022-12-23 ENCOUNTER — Encounter (HOSPITAL_COMMUNITY): Admission: RE | Payer: Self-pay | Source: Home / Self Care

## 2022-12-23 SURGERY — INSERTION OF ARTERIOVENOUS (AV) GORE-TEX GRAFT ARM
Anesthesia: Choice | Laterality: Left

## 2022-12-23 MED ORDER — CEFAZOLIN SODIUM-DEXTROSE 2-4 GM/100ML-% IV SOLN: 2.0000 g | INTRAVENOUS | Status: DC

## 2022-12-23 MED ORDER — CHLORHEXIDINE GLUCONATE 4 % EX LIQD: 60.0000 mL | Freq: Once | CUTANEOUS | Status: DC

## 2022-12-23 MED ORDER — SODIUM CHLORIDE 0.9 % IV SOLN: INTRAVENOUS | Status: DC

## 2022-12-25 ENCOUNTER — Telehealth: Payer: Self-pay

## 2022-12-25 NOTE — Telephone Encounter (Signed)
Call placed to patient to inquire about rescheduling his missed surgery on 12/23/22 for a left arm AVG vs AVF with Dr. Donnetta Hutching at [facility]. Spoke with patient's wife Rodena Piety, who advised patient has decided that he does not want to have surgery right now. Advised wife to have patient contact office when ready to schedule. She verbalized understanding. Dr. Donnetta Hutching made aware.

## 2023-10-11 DEATH — deceased
